# Patient Record
Sex: Female | Born: 1970 | Race: Black or African American | Hispanic: No | Marital: Married | Smoking: Never smoker
Health system: Southern US, Community
[De-identification: ages and names within clinical notes are randomized; demographics above are authoritative.]

## PROBLEM LIST (undated history)

## (undated) DIAGNOSIS — H539 Unspecified visual disturbance: Secondary | ICD-10-CM

## (undated) DIAGNOSIS — Z789 Other specified health status: Secondary | ICD-10-CM

## (undated) DIAGNOSIS — R51 Headache: Secondary | ICD-10-CM

## (undated) DIAGNOSIS — R519 Headache, unspecified: Secondary | ICD-10-CM

## (undated) HISTORY — DX: Headache: R51

## (undated) HISTORY — PX: WISDOM TOOTH EXTRACTION: SHX21

## (undated) HISTORY — DX: Headache, unspecified: R51.9

## (undated) HISTORY — DX: Unspecified visual disturbance: H53.9

---

## 1998-01-26 ENCOUNTER — Other Ambulatory Visit: Admission: RE | Admit: 1998-01-26 | Discharge: 1998-01-26 | Payer: Self-pay | Admitting: Obstetrics and Gynecology

## 1999-04-06 ENCOUNTER — Other Ambulatory Visit: Admission: RE | Admit: 1999-04-06 | Discharge: 1999-04-06 | Payer: Self-pay | Admitting: *Deleted

## 2009-05-26 ENCOUNTER — Ambulatory Visit: Payer: Self-pay | Admitting: Interventional Radiology

## 2009-05-26 ENCOUNTER — Emergency Department (HOSPITAL_BASED_OUTPATIENT_CLINIC_OR_DEPARTMENT_OTHER): Admission: EM | Admit: 2009-05-26 | Discharge: 2009-05-26 | Payer: Self-pay | Admitting: Emergency Medicine

## 2009-10-30 ENCOUNTER — Inpatient Hospital Stay (HOSPITAL_COMMUNITY): Admission: AD | Admit: 2009-10-30 | Discharge: 2009-10-30 | Payer: Self-pay | Admitting: Obstetrics and Gynecology

## 2009-10-31 ENCOUNTER — Ambulatory Visit (HOSPITAL_COMMUNITY): Admission: AD | Admit: 2009-10-31 | Discharge: 2009-10-31 | Payer: Self-pay | Admitting: Obstetrics and Gynecology

## 2010-09-12 IMAGING — CR DG ANKLE COMPLETE 3+V*L*
3 series · 3 of 3 positions shown · non-contrast
Comparison: None

CLINICAL DATA: Left ankle pain.

LEFT ANKLE COMPLETE - 3+ VIEW

[t ankle joint ap left]
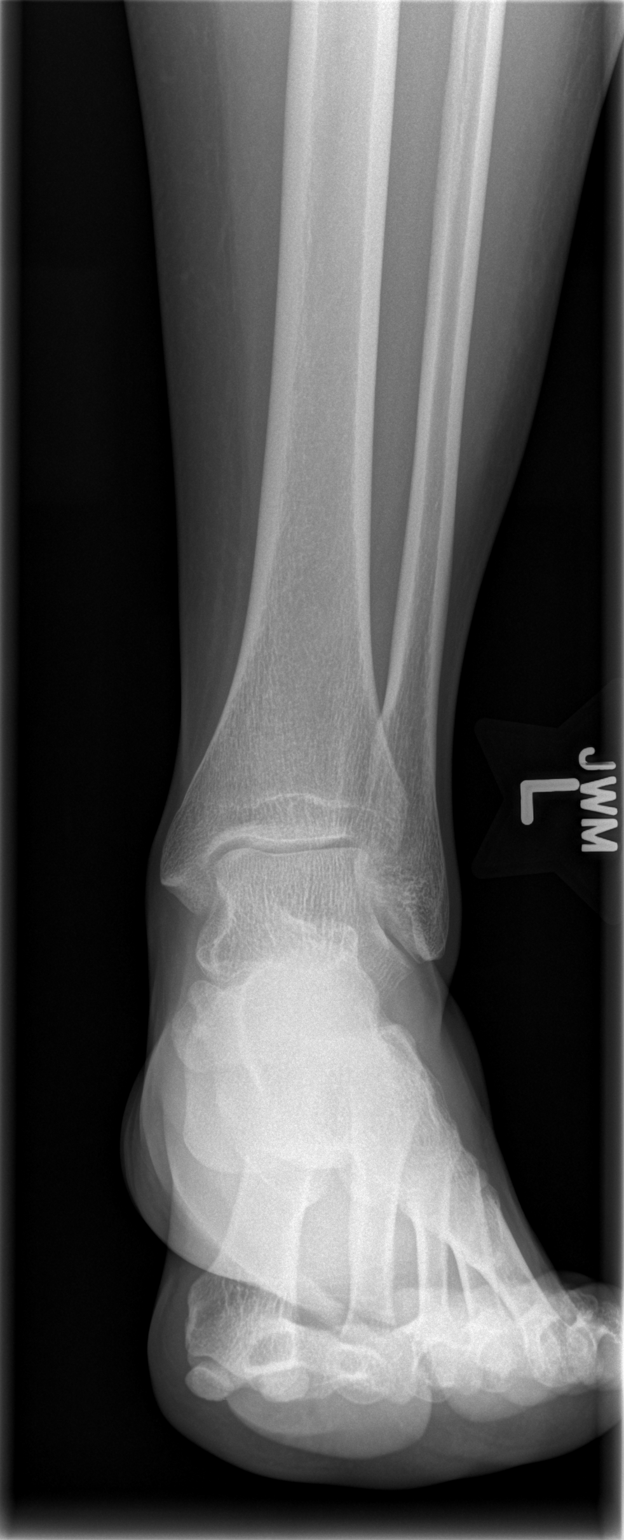

[t ankle joint oblique left]
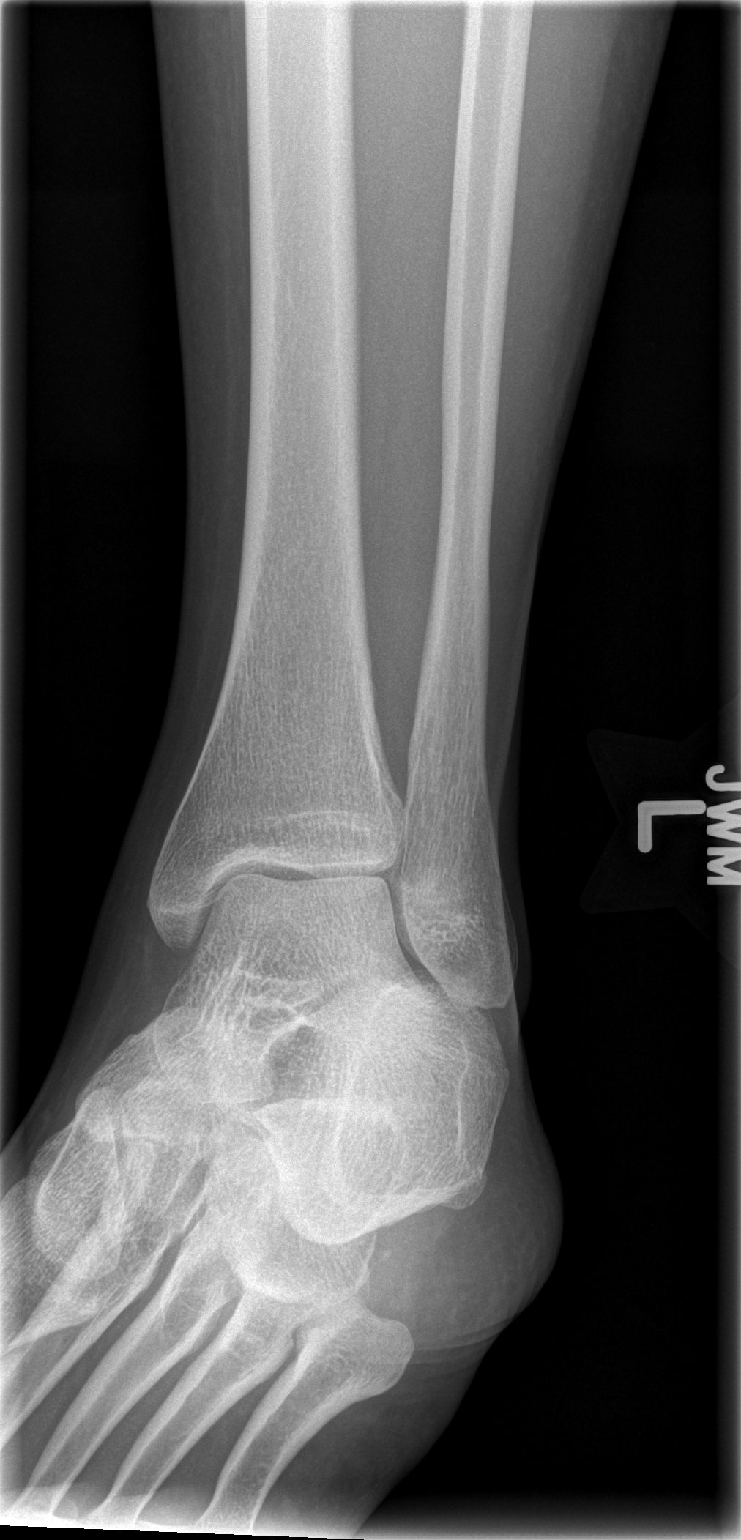

[t ankle joint lat left]
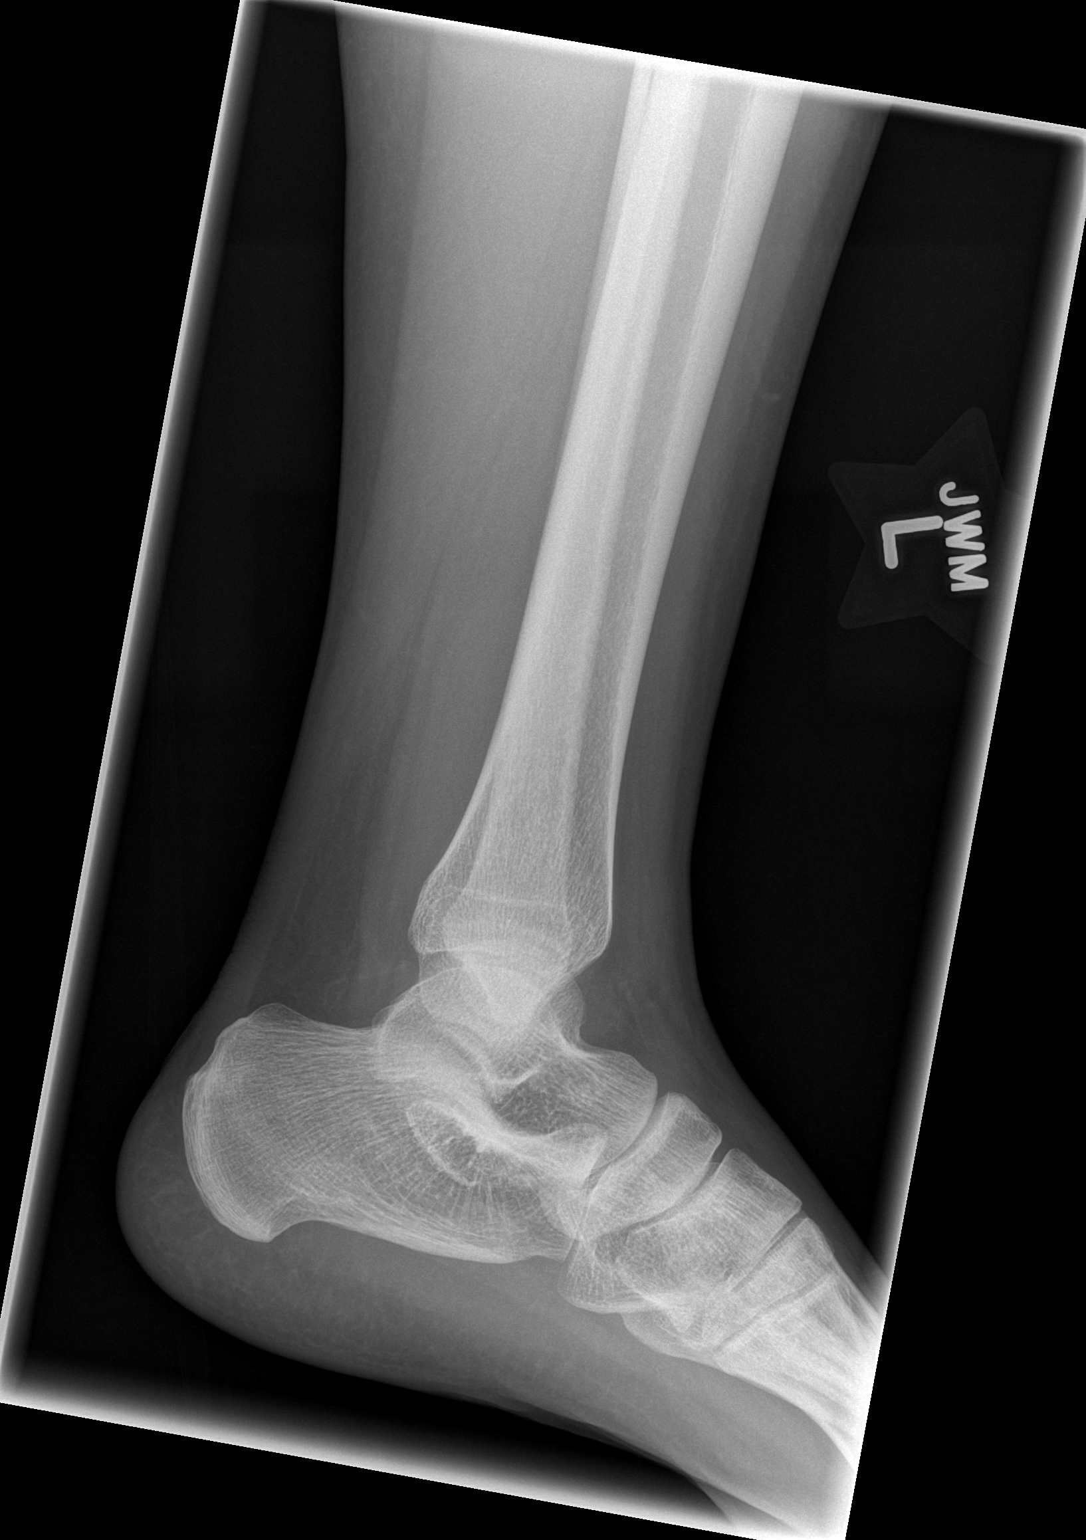

[3 of 3 positions shown; findings below may reference images not displayed]

FINDINGS: There is no evidence of fracture, dislocation or joint
effusion.  There is no evidence of arthropathy or other focal bone
abnormality.  Soft tissues are unremarkable.
IMPRESSION: Negative.

## 2010-09-26 NOTE — L&D Delivery Note (Signed)
Delivery Note  FHR with large variables, VE per RN complete. Arrived at bs and prepped for delivery, FHR 150's with variables. Pt pushed well.    At 8:50 PM a viable female was delivered via Vaginal, Spontaneous Delivery (Presentation: Right Occiput Anterior).   Infant was placed on mom's abdomen with lusty cry APGAR: 9, 9; weight 5 lb 14.7 oz (2685 g).   Placenta status: Intact, Spontaneous.  Cord: 3 vessels with the following complications: Short.  Placenta sent to pathology Routine cord blood collection: Cord blood donation: no   Anesthesia: Epidural  Episiotomy: None Lacerations:  1st degree Suture Repair: 3.0 vicryl rapide Est. Blood Loss (mL): 200cc  Mom to postpartum.  Baby to nursery-stable. Mom plans to BF  Elizabeth Andrews M 08/30/2011, 9:17 PM

## 2010-12-16 LAB — CBC
MCHC: 33.1 g/dL (ref 30.0–36.0)
RBC: 4.07 MIL/uL (ref 3.87–5.11)
WBC: 7.5 10*3/uL (ref 4.0–10.5)

## 2010-12-16 LAB — ABO/RH: ABO/RH(D): O POS

## 2010-12-16 LAB — HCG, QUANTITATIVE, PREGNANCY: hCG, Beta Chain, Quant, S: 87 m[IU]/mL — ABNORMAL HIGH (ref <5)

## 2010-12-16 LAB — TYPE AND SCREEN: Antibody Screen: NEGATIVE

## 2011-02-16 IMAGING — US US OB COMP LESS 14 WK
1 series · 13 of 28 positions shown · non-contrast
Comparison: None.

quantitative beta HCG only 87, presenting with vaginal bleeding
with the of 6 hours earlier.

OBSTETRIC <14 WK US AND TRANSVAGINAL OB US 10/30/2009:
TECHNIQUE: Both transabdominal and transvaginal ultrasound
examinations were performed for complete evaluation of the
gestation as well as the maternal uterus, adnexal regions, and
pelvic cul-de-sac.

[Series 1: us ob comp less 14 wks · 0.22mm/px · 47 acquisitions, 13 frames shown]
[im 2/47]
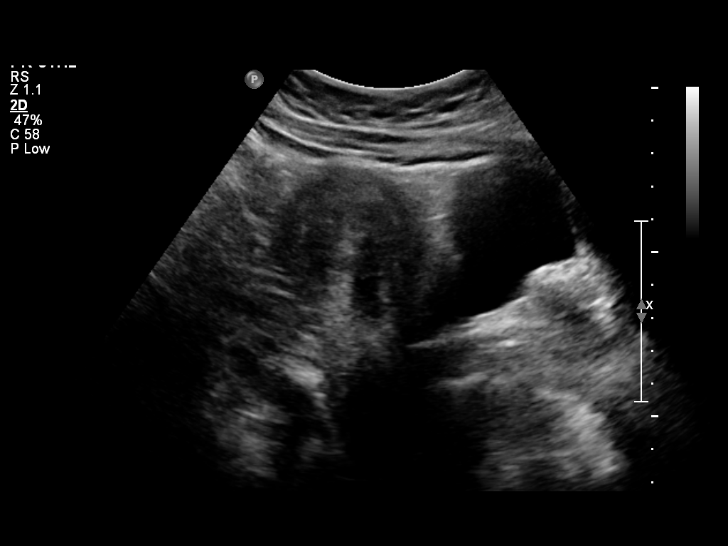
[im 6/47]
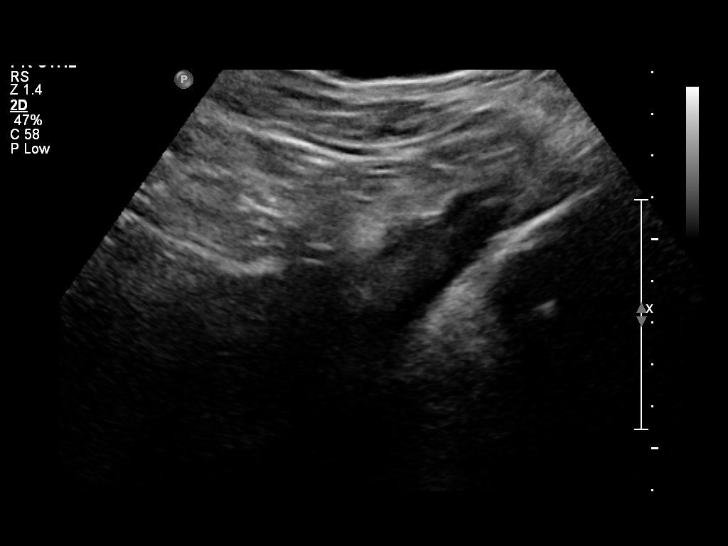
[im 9/47]
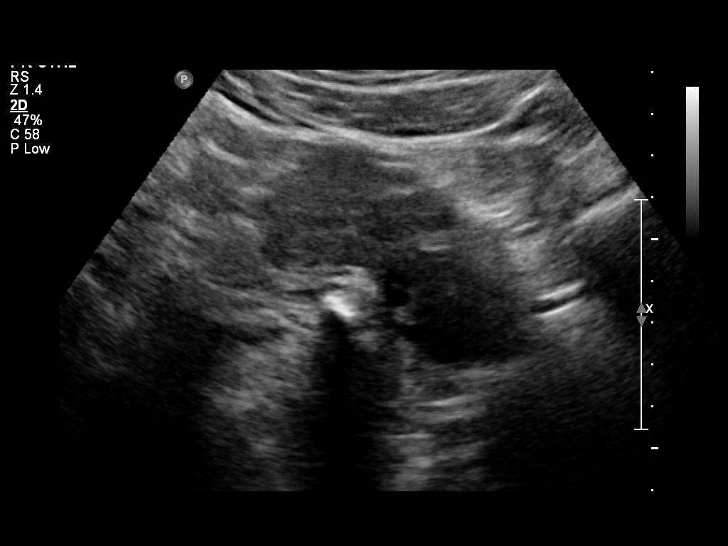
[im 12/47]
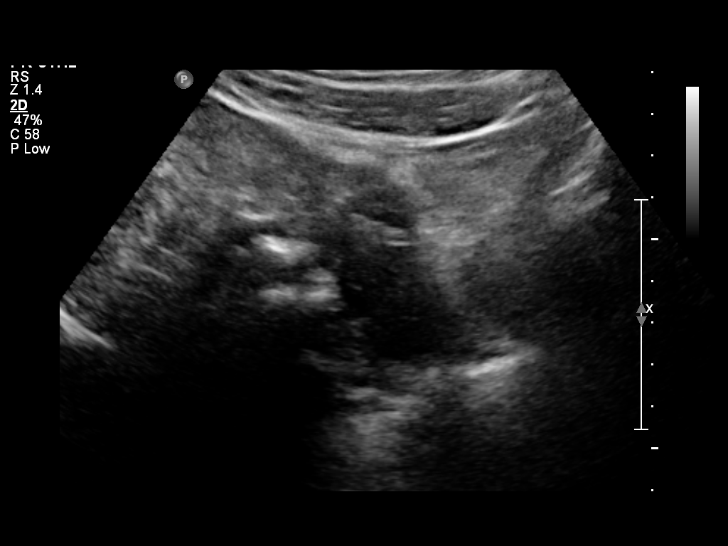
[im 16/47]
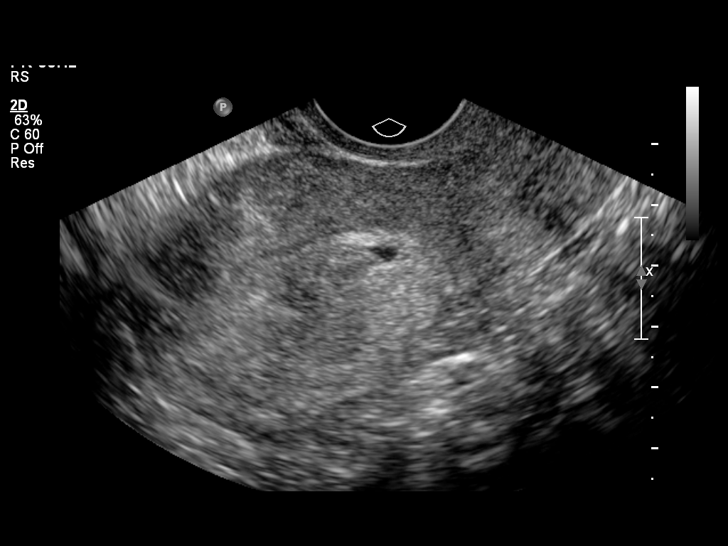
[im 19/47]
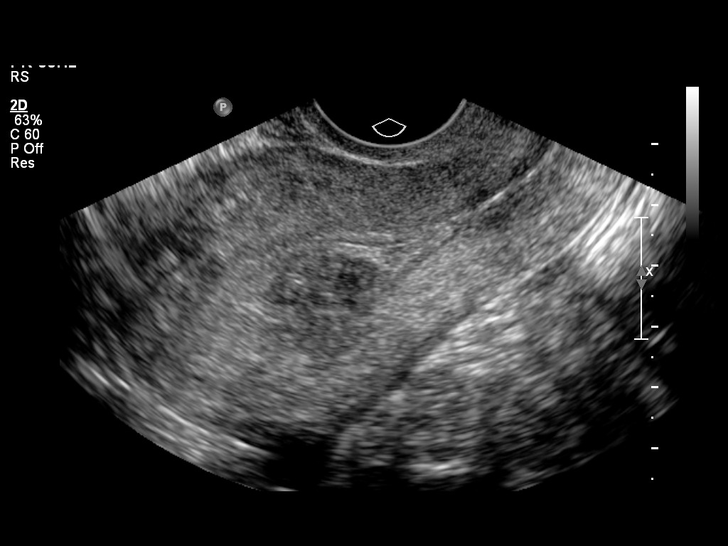
[im 24/47]
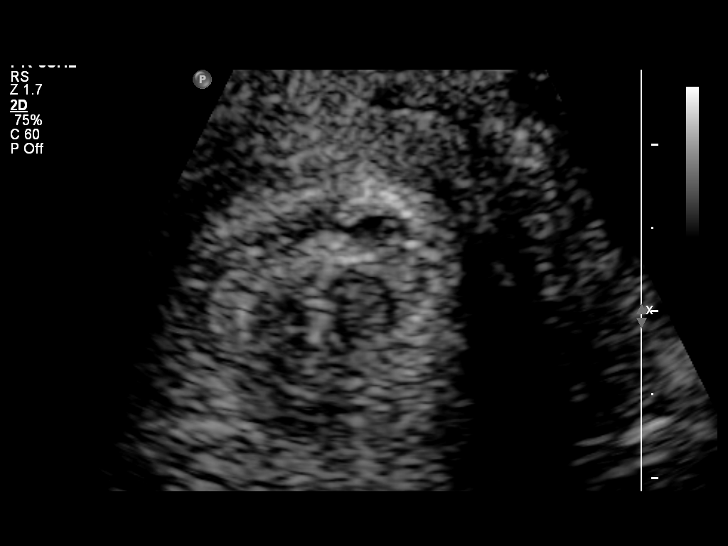
[im 28/47]
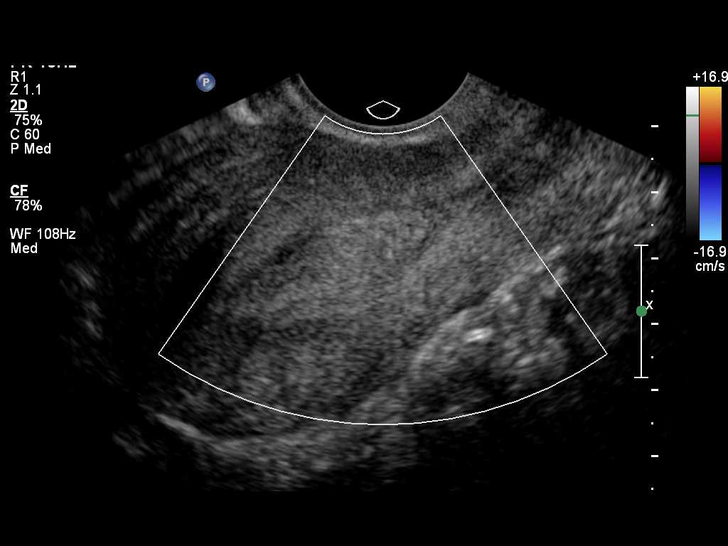
[im 31/47]
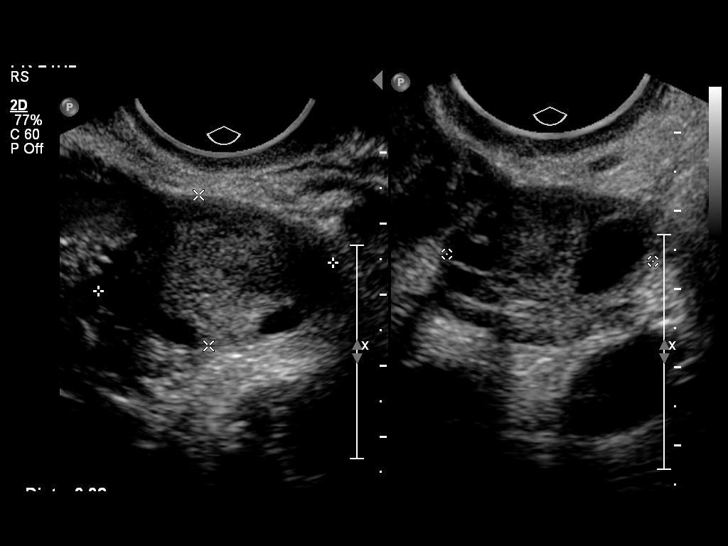
[im 35/47]
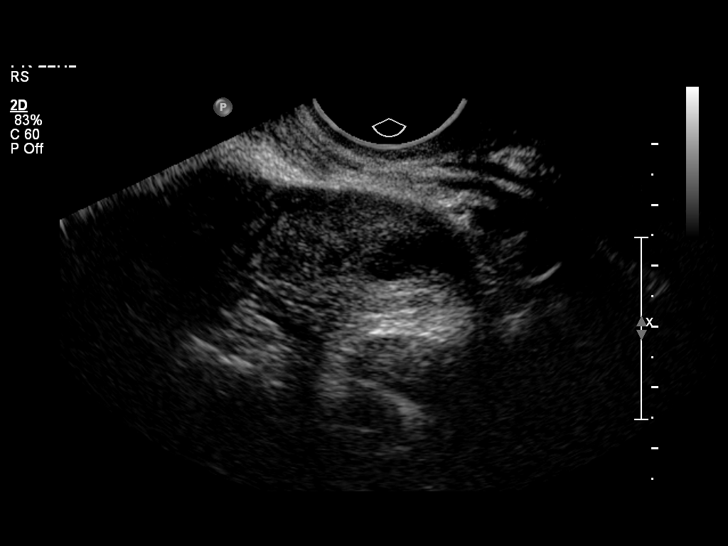
[im 38/47]
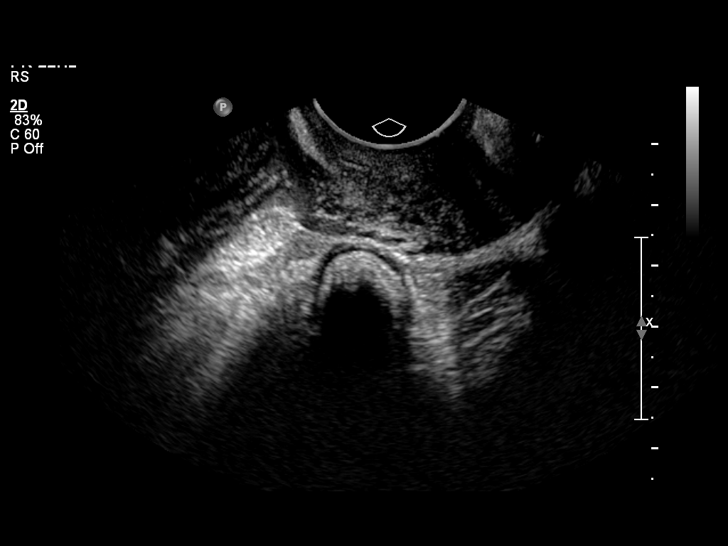
[im 41/47]
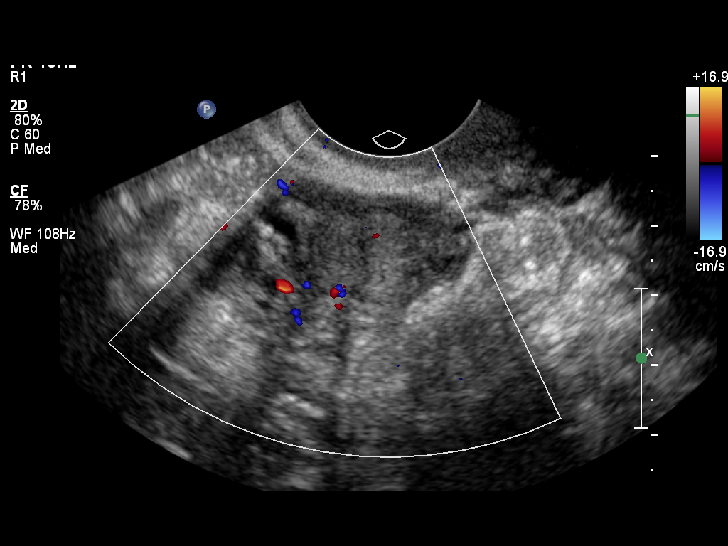
[im 45/47]
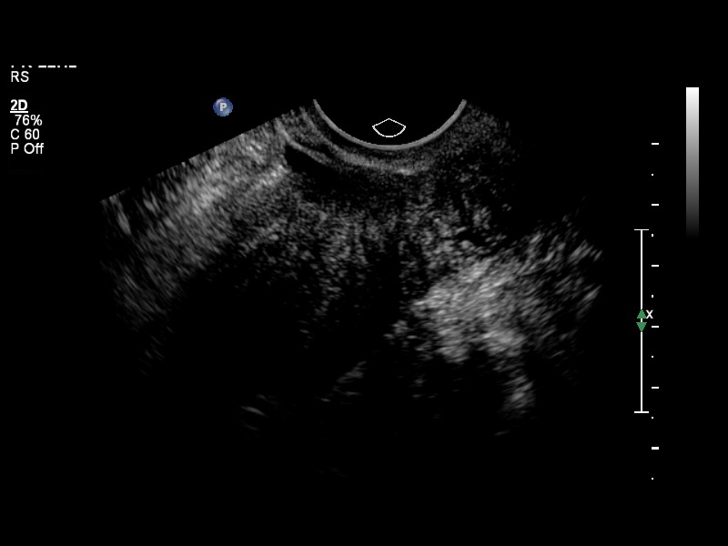

[13 of 28 positions shown; findings below may reference images not displayed]

Intrauterine gestational sac: Single, irregular.
Yolk sac: Visualized.
Embryo: Not visualized.
Cardiac Activity: Not visualized.

MSD: 3.1 mm
US EDC: Not available.

Maternal uterus/adnexae:
Tiny, irregular gestational sac in the lower uterine segment.
Approximate 2.1 x 1.2 x 0.9 cm solid lesion within the endometrial
cavity, demonstrating color Doppler signal.  Left ovary normal in
size and appearance, containing several small follicles, measuring
approximately 3.3 x 2.1 x 2.6 cm.  Right ovary normal in size and
appearance, containing several small follicles, measuring
approximately 3.1 x 1.6-2.2 cm.  No adnexal masses or free fluid.
IMPRESSION: 1.  Tiny, irregular gestational sac within the lower uterine
segment measuring only 3.1 mm, likely representing a blighted ovum
and impending spontaneous abortion.
2.  Approximate 2 cm solid lesion within the endometrial canal,
demonstrating color Doppler signal.  A primary endometrial lesion
or a submucosal fibroid mimicking an endometrial lesion are
considerations.

## 2011-08-30 ENCOUNTER — Inpatient Hospital Stay (HOSPITAL_COMMUNITY): Payer: 59 | Admitting: Anesthesiology

## 2011-08-30 ENCOUNTER — Encounter (HOSPITAL_COMMUNITY): Payer: Self-pay

## 2011-08-30 ENCOUNTER — Encounter (HOSPITAL_COMMUNITY): Payer: Self-pay | Admitting: Anesthesiology

## 2011-08-30 ENCOUNTER — Inpatient Hospital Stay (HOSPITAL_COMMUNITY)
Admission: AD | Admit: 2011-08-30 | Discharge: 2011-09-01 | DRG: 775 | Disposition: A | Discharge status: Home / Self Care | Payer: 59 | Source: Ambulatory Visit | Attending: Obstetrics and Gynecology | Admitting: Obstetrics and Gynecology

## 2011-08-30 DIAGNOSIS — O09529 Supervision of elderly multigravida, unspecified trimester: Secondary | ICD-10-CM | POA: Diagnosis present

## 2011-08-30 DIAGNOSIS — Z888 Allergy status to other drugs, medicaments and biological substances status: Secondary | ICD-10-CM | POA: Diagnosis present

## 2011-08-30 DIAGNOSIS — Z22330 Carrier of Group B streptococcus: Secondary | ICD-10-CM

## 2011-08-30 DIAGNOSIS — O99892 Other specified diseases and conditions complicating childbirth: Secondary | ICD-10-CM | POA: Diagnosis present

## 2011-08-30 DIAGNOSIS — Z3490 Encounter for supervision of normal pregnancy, unspecified, unspecified trimester: Secondary | ICD-10-CM

## 2011-08-30 DIAGNOSIS — Z6830 Body mass index (BMI) 30.0-30.9, adult: Secondary | ICD-10-CM

## 2011-08-30 HISTORY — DX: Other specified health status: Z78.9

## 2011-08-30 LAB — CBC
MCV: 82.2 fL (ref 78.0–100.0)
Platelets: 279 10*3/uL (ref 150–400)
RBC: 4.26 MIL/uL (ref 3.87–5.11)
RDW: 13.2 % (ref 11.5–15.5)

## 2011-08-30 LAB — RPR: RPR: NONREACTIVE

## 2011-08-30 LAB — HEPATITIS B SURFACE ANTIGEN: Hepatitis B Surface Ag: NEGATIVE

## 2011-08-30 LAB — ABO/RH: RH Type: POSITIVE

## 2011-08-30 MED ORDER — BENZOCAINE-MENTHOL 20-0.5 % EX AERO: 1.0000 "application " | INHALATION_SPRAY | CUTANEOUS | Status: DC | PRN

## 2011-08-30 MED ORDER — LACTATED RINGERS IV SOLN: 500.0000 mL | INTRAVENOUS | Status: DC | PRN

## 2011-08-30 MED ORDER — OXYTOCIN 10 UNIT/ML IJ SOLN
INTRAMUSCULAR | Status: AC
  Filled 2011-08-30: qty 2

## 2011-08-30 MED ORDER — FENTANYL 2.5 MCG/ML BUPIVACAINE 1/10 % EPIDURAL INFUSION (WH - ANES)
14.0000 mL/h | INTRAMUSCULAR | Status: DC
  Administered 2011-08-30: 14 mL/h via EPIDURAL
  Filled 2011-08-30 (×2): qty 60

## 2011-08-30 MED ORDER — OXYTOCIN BOLUS FROM INFUSION
500.0000 mL | Freq: Once | INTRAVENOUS | Status: DC
  Filled 2011-08-30: qty 1000
  Filled 2011-08-30: qty 500

## 2011-08-30 MED ORDER — DIBUCAINE 1 % RE OINT: 1.0000 "application " | TOPICAL_OINTMENT | RECTAL | Status: DC | PRN

## 2011-08-30 MED ORDER — LANOLIN HYDROUS EX OINT: TOPICAL_OINTMENT | CUTANEOUS | Status: DC | PRN

## 2011-08-30 MED ORDER — PANTOPRAZOLE SODIUM 20 MG PO TBEC
20.0000 mg | DELAYED_RELEASE_TABLET | Freq: Every day | ORAL | Status: DC
  Administered 2011-09-01: 20 mg via ORAL
  Filled 2011-08-30 (×2): qty 1

## 2011-08-30 MED ORDER — WITCH HAZEL-GLYCERIN EX PADS: 1.0000 "application " | MEDICATED_PAD | CUTANEOUS | Status: DC | PRN

## 2011-08-30 MED ORDER — PRENATAL PLUS 27-1 MG PO TABS
1.0000 | ORAL_TABLET | Freq: Every day | ORAL | Status: DC
  Administered 2011-09-01: 1 via ORAL
  Filled 2011-08-30: qty 1

## 2011-08-30 MED ORDER — OXYTOCIN 20 UNITS IN LACTATED RINGERS INFUSION - SIMPLE
1.0000 m[IU]/min | INTRAVENOUS | Status: DC
  Administered 2011-08-30: 2 m[IU]/min via INTRAVENOUS

## 2011-08-30 MED ORDER — DIPHENHYDRAMINE HCL 25 MG PO CAPS: 25.0000 mg | ORAL_CAPSULE | Freq: Four times a day (QID) | ORAL | Status: DC | PRN

## 2011-08-30 MED ORDER — FENTANYL 2.5 MCG/ML BUPIVACAINE 1/10 % EPIDURAL INFUSION (WH - ANES)
INTRAMUSCULAR | Status: DC | PRN
  Administered 2011-08-30: 14 mL/h via EPIDURAL

## 2011-08-30 MED ORDER — CITRIC ACID-SODIUM CITRATE 334-500 MG/5ML PO SOLN: 30.0000 mL | ORAL | Status: DC | PRN

## 2011-08-30 MED ORDER — ONDANSETRON HCL 4 MG/2ML IJ SOLN: 4.0000 mg | Freq: Four times a day (QID) | INTRAMUSCULAR | Status: DC | PRN

## 2011-08-30 MED ORDER — PENICILLIN G POTASSIUM 5000000 UNITS IJ SOLR
5.0000 10*6.[IU] | Freq: Once | INTRAVENOUS | Status: AC
  Administered 2011-08-30: 5 10*6.[IU] via INTRAVENOUS
  Filled 2011-08-30: qty 5

## 2011-08-30 MED ORDER — ZOLPIDEM TARTRATE 5 MG PO TABS: 5.0000 mg | ORAL_TABLET | Freq: Every evening | ORAL | Status: DC | PRN

## 2011-08-30 MED ORDER — EPHEDRINE 5 MG/ML INJ
10.0000 mg | INTRAVENOUS | Status: DC | PRN
  Filled 2011-08-30: qty 4

## 2011-08-30 MED ORDER — DIPHENHYDRAMINE HCL 50 MG/ML IJ SOLN: 12.5000 mg | INTRAMUSCULAR | Status: DC | PRN

## 2011-08-30 MED ORDER — ONDANSETRON HCL 4 MG/2ML IJ SOLN: 4.0000 mg | INTRAMUSCULAR | Status: DC | PRN

## 2011-08-30 MED ORDER — ACETAMINOPHEN 325 MG PO TABS: 650.0000 mg | ORAL_TABLET | ORAL | Status: DC | PRN

## 2011-08-30 MED ORDER — PHENYLEPHRINE 40 MCG/ML (10ML) SYRINGE FOR IV PUSH (FOR BLOOD PRESSURE SUPPORT): 80.0000 ug | PREFILLED_SYRINGE | INTRAVENOUS | Status: DC | PRN

## 2011-08-30 MED ORDER — LACTATED RINGERS IV SOLN
INTRAVENOUS | Status: DC
  Administered 2011-08-30 (×2): 125 mL/h via INTRAVENOUS

## 2011-08-30 MED ORDER — LACTATED RINGERS IV SOLN
500.0000 mL | Freq: Once | INTRAVENOUS | Status: AC
  Administered 2011-08-30: 500 mL via INTRAVENOUS

## 2011-08-30 MED ORDER — OXYTOCIN 20 UNITS IN LACTATED RINGERS INFUSION - SIMPLE: 125.0000 mL/h | Freq: Once | INTRAVENOUS | Status: DC

## 2011-08-30 MED ORDER — PHENYLEPHRINE 40 MCG/ML (10ML) SYRINGE FOR IV PUSH (FOR BLOOD PRESSURE SUPPORT)
80.0000 ug | PREFILLED_SYRINGE | INTRAVENOUS | Status: DC | PRN
  Filled 2011-08-30: qty 5

## 2011-08-30 MED ORDER — SENNOSIDES-DOCUSATE SODIUM 8.6-50 MG PO TABS
2.0000 | ORAL_TABLET | Freq: Every day | ORAL | Status: DC
  Administered 2011-08-31: 2 via ORAL

## 2011-08-30 MED ORDER — TETANUS-DIPHTH-ACELL PERTUSSIS 5-2.5-18.5 LF-MCG/0.5 IM SUSP: 0.5000 mL | Freq: Once | INTRAMUSCULAR | Status: DC

## 2011-08-30 MED ORDER — OXYCODONE-ACETAMINOPHEN 5-325 MG PO TABS: 2.0000 | ORAL_TABLET | ORAL | Status: DC | PRN

## 2011-08-30 MED ORDER — OXYCODONE-ACETAMINOPHEN 5-325 MG PO TABS: 1.0000 | ORAL_TABLET | ORAL | Status: DC | PRN

## 2011-08-30 MED ORDER — SIMETHICONE 80 MG PO CHEW: 80.0000 mg | CHEWABLE_TABLET | ORAL | Status: DC | PRN

## 2011-08-30 MED ORDER — PENICILLIN G POTASSIUM 5000000 UNITS IJ SOLR
2.5000 10*6.[IU] | INTRAMUSCULAR | Status: DC
  Administered 2011-08-30: 2.5 10*6.[IU] via INTRAVENOUS
  Filled 2011-08-30 (×4): qty 2.5

## 2011-08-30 MED ORDER — ONDANSETRON HCL 4 MG PO TABS: 4.0000 mg | ORAL_TABLET | ORAL | Status: DC | PRN

## 2011-08-30 MED ORDER — IBUPROFEN 600 MG PO TABS
600.0000 mg | ORAL_TABLET | Freq: Four times a day (QID) | ORAL | Status: DC
  Administered 2011-08-31 – 2011-09-01 (×6): 600 mg via ORAL
  Filled 2011-08-30 (×6): qty 1

## 2011-08-30 MED ORDER — OXYTOCIN 10 UNIT/ML IJ SOLN: 10.0000 [IU] | Freq: Once | INTRAMUSCULAR | Status: DC

## 2011-08-30 MED ORDER — LIDOCAINE HCL 1.5 % IJ SOLN
INTRAMUSCULAR | Status: DC | PRN
  Administered 2011-08-30 (×2): 4 mL via EPIDURAL

## 2011-08-30 MED ORDER — EPHEDRINE 5 MG/ML INJ: 10.0000 mg | INTRAVENOUS | Status: DC | PRN

## 2011-08-30 MED ORDER — TERBUTALINE SULFATE 1 MG/ML IJ SOLN: 0.2500 mg | Freq: Once | INTRAMUSCULAR | Status: DC | PRN

## 2011-08-30 MED ORDER — IBUPROFEN 600 MG PO TABS: 600.0000 mg | ORAL_TABLET | Freq: Four times a day (QID) | ORAL | Status: DC | PRN

## 2011-08-30 MED ORDER — LIDOCAINE HCL (PF) 1 % IJ SOLN
30.0000 mL | INTRAMUSCULAR | Status: DC | PRN
  Filled 2011-08-30 (×2): qty 30

## 2011-08-30 NOTE — Progress Notes (Signed)
Patient state she started leaking clear fluid about 1130, had a gush and continues to leak. Panties wet. Reports good fetal movement, no contractions.

## 2011-08-30 NOTE — H&P (Signed)
Elizabeth Andrews is a 40 y.o. female presenting for SROM at 1100, large gush of clear fluid, continues leaking. Denies ctx at present, has felt some mild cramping, denies VB, has +FM Pregnancy significant for: 1. AMA 2. Iodine allergy 3. S<D with growth normal at 37wks 4. Elevated BMI 5. Hx CT  Maternal Medical History:  Reason for admission: Reason for admission: rupture of membranes.  Contractions: Denies at present  Fetal activity: Perceived fetal activity is normal.   Last perceived fetal movement was within the past hour.    Prenatal complications: no prenatal complications   HPI: Pt entered care at [facility] under the care of Dr. Stefano Gaul, at 10wks, she received an initial [place] that measured at 12wks so EDC was changed to 12/19. She had a routine prenatal course, until 36wks and was measuring S<D, an US showed normal growth at 60%. She had a +GBS cx at 36wks.   Pt had a SVD in '97, female 7-15, epidural, without complications 2'11 - 7 wk SAB no comp 8'11 - 7 wk SAB no comp  OB History    Grav Para Term Preterm Abortions TAB SAB Ect Mult Living   4 1 1  0 2 0 2 0 0 1     Past Medical History  Diagnosis Date  . No pertinent past medical history    Past Surgical History  Procedure Date  . Wisdom tooth extraction    Family History:  Heart dx: Father CHTN: Mother, father Varicosities: mother IDDM: mother Stomach CA: father  Social History:  reports that she has never smoked. She has never used smokeless tobacco. She reports that she does not drink alcohol or use illicit drugs. Pt is married to Elizabeth Andrews, baptist faith, 34yrs of education.   Review of Systems  All other systems reviewed and are negative.    Dilation: 3 Effacement (%): 70;80 Station: -1 Exam by:: S Masiyah Jorstad CNM Blood pressure 137/79, pulse 80, temperature 98.2 F (36.8 C), temperature source Oral, resp. rate 18, height 5' 5.5" (1.664 m), weight 89.54 kg (197 lb 6.4 oz), SpO2 98.00%. Maternal Exam:    Uterine Assessment: Contraction strength is mild.  Contraction duration is 50 seconds. Contraction frequency is rare and regular.   Abdomen: Estimated fetal weight is 7#.   Fetal presentation: vertex  Introitus: Normal vulva. Vagina is positive for vaginal discharge.  Ferning test: not done.  Amniotic fluid character: clear.  Pelvis: adequate for delivery.   Cervix: Cervix evaluated by digital exam.     Fetal Exam Fetal Monitor Review: Mode: ultrasound.   Baseline rate: 140.  Variability: moderate (6-25 bpm).   Pattern: accelerations present and no decelerations.    Fetal State Assessment: Category I - tracings are normal.     Physical Exam  Nursing note and vitals reviewed. Constitutional: She is oriented to person, place, and time. Vital signs are normal. She appears well-developed and well-nourished.  Neck: Normal range of motion.  Cardiovascular: Normal rate, regular rhythm and normal heart sounds.   Respiratory: Effort normal and breath sounds normal.  GI: Soft. She exhibits no distension. There is no tenderness.  Genitourinary: Vaginal discharge found.       Grossly ruptured, clear fluid cx 3/75/-1 Same as previous exam in office  Musculoskeletal: Normal range of motion. She exhibits no edema and no tenderness.  Neurological: She is alert and oriented to person, place, and time. She has normal reflexes.  Skin: Skin is warm and dry.  Psychiatric: She has a normal  mood and affect. Her behavior is normal. Judgment and thought content normal.    Prenatal labs: ABO, Rh: O/Positive/-- (12/04 0000) Antibody:  neg Rubella: Immune (12/04 0000) RPR: Nonreactive (12/04 0000)  HBsAg: Negative (12/04 0000)  HIV: Non-reactive (12/04 0000)  GBS: Positive (12/04 0000)  1hr gtt 107 28wk labs - hgb 11.6, RPR - NR Pt declined any genetic screens or amnio  Assessment/Plan: IUP at [redacted]w[redacted]d SROM GBS pos AMA FHR reassuring  Admit to birthing suites, CNM care - Dr. Su Hilt  attending PCN for GBS prophylaxis Epidural when pt requests May consider pitocin if no cervical change 6-8 hours   Cloud Graham M 08/30/2011, 1:52 PM

## 2011-08-30 NOTE — Anesthesia Procedure Notes (Signed)
Epidural Patient location during procedure: OB Start time: 08/30/2011 3:36 PM  Staffing Anesthesiologist: Aryon Nham A. Performed by: anesthesiologist   Preanesthetic Checklist Completed: patient identified, site marked, surgical consent, pre-op evaluation, timeout performed, IV checked, risks and benefits discussed and monitors and equipment checked  Epidural Patient position: sitting Prep: site prepped and draped and DuraPrep Patient monitoring: continuous pulse ox and blood pressure Approach: midline Injection technique: LOR air  Needle:  Needle type: Tuohy  Needle gauge: 17 G Needle length: 9 cm Needle insertion depth: 5 cm cm Catheter type: closed end flexible Catheter size: 19 Gauge Catheter at skin depth: 10 cm Test dose: negative and 1.5% lidocaine  Assessment Events: blood not aspirated, injection not painful, no injection resistance, negative IV test and no paresthesia  Additional Notes Patient is more comfortable after epidural dosed. Please see RN's note for documentation of vital signs and FHR which are stable.

## 2011-08-30 NOTE — Anesthesia Preprocedure Evaluation (Signed)
Anesthesia Evaluation  Patient identified by MRN, date of birth, ID band Patient awake    Reviewed: Allergy & Precautions, H&P , Patient's Chart, lab work & pertinent test results  Airway Mallampati: III TM Distance: >3 FB Neck ROM: full    Dental No notable dental hx. (+) Teeth Intact   Pulmonary neg pulmonary ROS,  clear to auscultation  Pulmonary exam normal       Cardiovascular neg cardio ROS regular Normal    Neuro/Psych Negative Neurological ROS  Negative Psych ROS   GI/Hepatic negative GI ROS, Neg liver ROS,   Endo/Other  Negative Endocrine ROS  Renal/GU negative Renal ROS  Genitourinary negative   Musculoskeletal   Abdominal Normal abdominal exam  (+)   Peds  Hematology negative hematology ROS (+)   Anesthesia Other Findings   Reproductive/Obstetrics (+) Pregnancy                           Anesthesia Physical Anesthesia Plan  ASA: II  Anesthesia Plan: Epidural   Post-op Pain Management:    Induction:   Airway Management Planned:   Additional Equipment:   Intra-op Plan:   Post-operative Plan:   Informed Consent: I have reviewed the patients History and Physical, chart, labs and discussed the procedure including the risks, benefits and alternatives for the proposed anesthesia with the patient or authorized representative who has indicated his/her understanding and acceptance.     Plan Discussed with: Anesthesiologist  Anesthesia Plan Comments:         Anesthesia Quick Evaluation  

## 2011-08-31 ENCOUNTER — Encounter (HOSPITAL_COMMUNITY): Payer: Self-pay | Admitting: *Deleted

## 2011-08-31 ENCOUNTER — Other Ambulatory Visit: Payer: Self-pay | Admitting: Obstetrics and Gynecology

## 2011-08-31 LAB — CBC
Platelets: 264 10*3/uL (ref 150–400)
RBC: 4.04 MIL/uL (ref 3.87–5.11)
RDW: 13.4 % (ref 11.5–15.5)
WBC: 12.6 10*3/uL — ABNORMAL HIGH (ref 4.0–10.5)

## 2011-08-31 NOTE — Progress Notes (Signed)
Post Partum Day 1 Subjective: no complaints.  Up ad lib.  Breastfeeding.  Minimal pain.  Family at bedside.  Objective: Blood pressure 103/63, pulse 80, temperature 98.4 F (36.9 C), temperature source Oral, resp. rate 18, height 5' 5.5" (1.664 m), weight 89.54 kg (197 lb 6.4 oz), SpO2 96.00%, unknown if currently breastfeeding.  Physical Exam:  General: alert Lochia: appropriate Uterine Fundus: firm Incision: healing well DVT Evaluation: No evidence of DVT seen on physical exam. Negative Homan's sign.   Basename 08/31/11 0530 08/30/11 1301  HGB 11.2* 12.0  HCT 33.6* 35.0*    Assessment/Plan: Plan for discharge tomorrow Will discuss contraception with patient tomorrow, when privacy available.   LOS: 1 day   Elizabeth Andrews 08/31/2011, 11:13 AM

## 2011-08-31 NOTE — Anesthesia Postprocedure Evaluation (Signed)
  Anesthesia Post-op Note  Patient: Elizabeth Andrews  Procedure(s) Performed: * No procedures listed *  Patient Location: Mother/Baby  Anesthesia Type: Epidural  Level of Consciousness: alert  and oriented  Airway and Oxygen Therapy: Patient Spontanous Breathing  Post-op Pain: mild  Post-op Assessment: Patient's Cardiovascular Status Stable and Respiratory Function Stable  Post-op Vital Signs: stable  Complications: No apparent anesthesia complications

## 2011-09-01 MED ORDER — IBUPROFEN 600 MG PO TABS: 600.0000 mg | ORAL_TABLET | Freq: Four times a day (QID) | ORAL | Status: AC | PRN

## 2011-09-01 NOTE — Discharge Summary (Signed)
Obstetric Discharge Summary Reason for Admission: onset of labor and rupture of membranes Prenatal Procedures: ultrasound Intrapartum Procedures: spontaneous vaginal delivery Postpartum Procedures: none Complications-Operative and Postpartum: 1st degree perineal laceration Hemoglobin  Date Value Range Status  08/31/2011 11.2* 12.0-15.0 (g/dL) Final     HCT  Date Value Range Status  08/31/2011 33.6* 36.0-46.0 (%) Final    Hospital Course: Admitted in early labor with SROM on 08/29/11.  Positive GBS. Progressed well. Delivery was performed by Sanda Klein without difficulty. Patient and baby tolerated the procedure without difficulty, with a 1st degree laceration noted. Infant to FTN. Mother and infant then had an uncomplicated postpartum course, with breast feeding going well. Mom's physical exam was WNL, and she was discharged home in stable condition. Contraception plan--considering outpatient BTL, but no completely decided..  She received adequate benefit from po pain medications.  Information regarding outpatient BTL will be sent to patient.  Physical exam on day of discharge: Chest clear Heart RRR without murmur Abd soft, NT Fundus firm, NT Lochia scant Perineum healing Ext WNL, no evidence DVT  Discharge Diagnoses: Term Pregnancy-delivered  Discharge Information: Date: 09/01/2011 Activity: Per [facility] handout Diet: routine Medications: Ibuprofen Condition: stable Instructions: refer to practice specific booklet Discharge to: home Contraception:  Considering outpatient BTL Follow-up Information    Follow up with [facility] in 4 weeks.         Newborn Data: Live born female  Birth Weight: 5 lb 14.7 oz (2685 g) APGAR: 9, 9  Home with mother.  Nigel Bridgeman 09/01/2011, 7:39 AM

## 2011-10-11 ENCOUNTER — Other Ambulatory Visit: Payer: Self-pay | Admitting: Obstetrics and Gynecology

## 2011-10-12 ENCOUNTER — Encounter (HOSPITAL_COMMUNITY): Payer: Self-pay | Admitting: Pharmacist

## 2011-10-12 ENCOUNTER — Other Ambulatory Visit: Payer: Self-pay | Admitting: Obstetrics and Gynecology

## 2011-10-17 ENCOUNTER — Encounter (HOSPITAL_COMMUNITY): Payer: Self-pay

## 2011-10-17 ENCOUNTER — Encounter (HOSPITAL_COMMUNITY)
Admission: RE | Admit: 2011-10-17 | Discharge: 2011-10-17 | Disposition: A | Discharge status: Home / Self Care | Payer: 59 | Source: Ambulatory Visit | Attending: Obstetrics and Gynecology | Admitting: Obstetrics and Gynecology

## 2011-10-17 LAB — CBC
Platelets: 273 10*3/uL (ref 150–400)
RDW: 13 % (ref 11.5–15.5)
WBC: 5.1 10*3/uL (ref 4.0–10.5)

## 2011-10-17 LAB — SURGICAL PCR SCREEN: Staphylococcus aureus: NEGATIVE

## 2011-10-17 NOTE — Patient Instructions (Addendum)
   Your procedure is scheduled on: Saturday January 26th  Enter through the Main Entrance of [facility] at:7:30am Pick up the phone at the desk and dial [phone] and inform us of your arrival.  Please call this number if you have any problems the morning of surgery: [phone]  Remember: Do not eat food after midnight: Friday Do not drink clear liquids after:midnight Friday Take these medicines the morning of surgery with a SIP OF WATER:none  Do not wear jewelry, make-up, or FINGER nail polish Do not wear lotions, powders, perfumes or deodorant. Do not shave 48 hours prior to surgery. Do not bring valuables to the hospital.    Patients discharged on the day of surgery will not be allowed to drive home.     Remember to use your hibiclens as instructed.Please shower with 1/2 bottle the evening before your surgery and the other 1/2 bottle the morning of surgery.

## 2011-10-21 NOTE — H&P (Signed)
Elizabeth Andrews, Elizabeth Andrews             ACCOUNT NO.:  [id number]  MEDICAL RECORD NO.:  [id number]  LOCATION:  PERIO                         FACILITY:  [facility]  PHYSICIAN:  Janine Limbo, M.D.DATE OF BIRTH:  05-03-1971  DATE OF ADMISSION:  10/11/2011 DATE OF DISCHARGE:                             HISTORY & PHYSICAL   HISTORY OF PRESENT ILLNESS:  Elizabeth Andrews is a 41 year old female, para 2-0- 2-2, who presents for a laparoscopic tubal cautery.  The patient has been followed at the [facility] and [facility] of [facility].  OBSTETRICAL HISTORY:  The patient has had 2 term vaginal deliveries and 2 first trimester miscarriages.  She did have a dilatation and curettage for one of her miscarriages.  DRUG ALLERGIES:  The patient is allergic to IODINE and this causes hives.  SOCIAL HISTORY:  The patient denies cigarette use, alcohol use, and recreational drug use.  PAST MEDICAL HISTORY:  The patient has had her wisdom teeth removed. She has had 2 vaginal deliveries.  REVIEW OF SYSTEMS:  Please see history of present illness.  FAMILY HISTORY:  Noncontributory.  PHYSICAL EXAMINATION:  VITAL SIGNS:  Height is 5 feet and 5 inches. Weight is 180 pounds. HEENT:  Within normal limits. CHEST:  Clear. HEART:  Regular rate and rhythm. BREASTS:  Lactating and nontender. ABDOMEN:  Nontender. EXTREMITIES:  Within normal limits. NEUROLOGIC:  Grossly normal. PELVIC:  External genitalia is normal.  Vagina is normal.  Cervix is nontender.  Uterus is normal size, shape, and consistency and adnexa, no masses.  ASSESSMENT:  Desires sterilization.  PLAN:  The patient will undergo a laparoscopic tubal cautery.  She understands the indications for her surgical procedure, and she accepts the risks of, but not limited to, anesthetic complications, bleeding, infections, possible damage to the surrounding organs, and possible tubal failure  (17/1000).     Janine Limbo, M.D.     AVS/MEDQ  D:  10/20/2011  T:  10/21/2011  Job:  [id number]

## 2011-10-22 ENCOUNTER — Encounter (HOSPITAL_COMMUNITY): Payer: Self-pay | Admitting: Anesthesiology

## 2011-10-22 ENCOUNTER — Ambulatory Visit (HOSPITAL_COMMUNITY)
Admission: RE | Admit: 2011-10-22 | Discharge: 2011-10-22 | Disposition: A | Discharge status: Home / Self Care | Payer: 59 | Source: Ambulatory Visit | Attending: Obstetrics and Gynecology | Admitting: Obstetrics and Gynecology

## 2011-10-22 ENCOUNTER — Encounter (HOSPITAL_COMMUNITY)
Admission: RE | Disposition: A | Discharge status: Home / Self Care | Payer: Self-pay | Source: Ambulatory Visit | Attending: Obstetrics and Gynecology

## 2011-10-22 ENCOUNTER — Ambulatory Visit (HOSPITAL_COMMUNITY): Payer: 59 | Admitting: Anesthesiology

## 2011-10-22 ENCOUNTER — Encounter (HOSPITAL_COMMUNITY): Payer: Self-pay | Admitting: *Deleted

## 2011-10-22 DIAGNOSIS — Z01818 Encounter for other preprocedural examination: Secondary | ICD-10-CM | POA: Insufficient documentation

## 2011-10-22 DIAGNOSIS — Z302 Encounter for sterilization: Secondary | ICD-10-CM | POA: Insufficient documentation

## 2011-10-22 DIAGNOSIS — Z01812 Encounter for preprocedural laboratory examination: Secondary | ICD-10-CM | POA: Insufficient documentation

## 2011-10-22 HISTORY — PX: LAPAROSCOPIC TUBAL LIGATION: SHX1937

## 2011-10-22 LAB — PREGNANCY, URINE: Preg Test, Ur: NEGATIVE

## 2011-10-22 SURGERY — LIGATION, FALLOPIAN TUBE, LAPAROSCOPIC
Anesthesia: General | Laterality: Bilateral

## 2011-10-22 MED ORDER — FENTANYL CITRATE 0.05 MG/ML IJ SOLN
INTRAMUSCULAR | Status: AC
  Filled 2011-10-22: qty 2

## 2011-10-22 MED ORDER — ROCURONIUM BROMIDE 100 MG/10ML IV SOLN
INTRAVENOUS | Status: DC | PRN
  Administered 2011-10-22: 40 mg via INTRAVENOUS

## 2011-10-22 MED ORDER — FENTANYL CITRATE 0.05 MG/ML IJ SOLN: 25.0000 ug | INTRAMUSCULAR | Status: DC | PRN

## 2011-10-22 MED ORDER — DEXAMETHASONE SODIUM PHOSPHATE 10 MG/ML IJ SOLN
INTRAMUSCULAR | Status: AC
  Filled 2011-10-22: qty 1

## 2011-10-22 MED ORDER — NEOSTIGMINE METHYLSULFATE 1 MG/ML IJ SOLN
INTRAMUSCULAR | Status: AC
  Filled 2011-10-22: qty 10

## 2011-10-22 MED ORDER — LIDOCAINE HCL (CARDIAC) 20 MG/ML IV SOLN
INTRAVENOUS | Status: DC | PRN
  Administered 2011-10-22: 100 mg via INTRAVENOUS

## 2011-10-22 MED ORDER — MEPERIDINE HCL 25 MG/ML IJ SOLN: 6.2500 mg | INTRAMUSCULAR | Status: DC | PRN

## 2011-10-22 MED ORDER — METOCLOPRAMIDE HCL 5 MG/ML IJ SOLN: 10.0000 mg | Freq: Once | INTRAMUSCULAR | Status: DC | PRN

## 2011-10-22 MED ORDER — ONDANSETRON HCL 4 MG/2ML IJ SOLN
INTRAMUSCULAR | Status: AC
  Filled 2011-10-22: qty 2

## 2011-10-22 MED ORDER — KETOROLAC TROMETHAMINE 30 MG/ML IJ SOLN
INTRAMUSCULAR | Status: DC | PRN
  Administered 2011-10-22: 30 mg via INTRAMUSCULAR
  Administered 2011-10-22: 30 mg via INTRAVENOUS

## 2011-10-22 MED ORDER — FENTANYL CITRATE 0.05 MG/ML IJ SOLN
INTRAMUSCULAR | Status: DC | PRN
  Administered 2011-10-22 (×2): 50 ug via INTRAVENOUS

## 2011-10-22 MED ORDER — IBUPROFEN 200 MG PO TABS: 800.0000 mg | ORAL_TABLET | Freq: Three times a day (TID) | ORAL | Status: AC | PRN

## 2011-10-22 MED ORDER — NEOSTIGMINE METHYLSULFATE 1 MG/ML IJ SOLN
INTRAMUSCULAR | Status: DC | PRN
  Administered 2011-10-22: 3 mg via INTRAVENOUS

## 2011-10-22 MED ORDER — HYDROCODONE-ACETAMINOPHEN 5-500 MG PO TABS: 1.0000 | ORAL_TABLET | ORAL | Status: AC | PRN

## 2011-10-22 MED ORDER — GLYCOPYRROLATE 0.2 MG/ML IJ SOLN
INTRAMUSCULAR | Status: AC
  Filled 2011-10-22: qty 1

## 2011-10-22 MED ORDER — MIDAZOLAM HCL 5 MG/5ML IJ SOLN
INTRAMUSCULAR | Status: DC | PRN
  Administered 2011-10-22: 2 mg via INTRAVENOUS

## 2011-10-22 MED ORDER — PROPOFOL 10 MG/ML IV EMUL
INTRAVENOUS | Status: DC | PRN
  Administered 2011-10-22: 20 mL via INTRAVENOUS

## 2011-10-22 MED ORDER — ONDANSETRON HCL 4 MG/2ML IJ SOLN
INTRAMUSCULAR | Status: DC | PRN
  Administered 2011-10-22: 4 mg via INTRAVENOUS

## 2011-10-22 MED ORDER — GLYCOPYRROLATE 0.2 MG/ML IJ SOLN
INTRAMUSCULAR | Status: DC | PRN
  Administered 2011-10-22: .6 mg via INTRAVENOUS

## 2011-10-22 MED ORDER — LACTATED RINGERS IV SOLN
INTRAVENOUS | Status: DC | PRN
  Administered 2011-10-22 (×2): via INTRAVENOUS

## 2011-10-22 MED ORDER — BUPIVACAINE-EPINEPHRINE (PF) 0.5% -1:200000 IJ SOLN
INTRAMUSCULAR | Status: AC
  Filled 2011-10-22: qty 10

## 2011-10-22 MED ORDER — LIDOCAINE HCL (CARDIAC) 20 MG/ML IV SOLN
INTRAVENOUS | Status: AC
  Filled 2011-10-22: qty 5

## 2011-10-22 MED ORDER — PROMETHAZINE HCL 12.5 MG PO TABS: 12.5000 mg | ORAL_TABLET | Freq: Four times a day (QID) | ORAL | Status: AC | PRN

## 2011-10-22 MED ORDER — MIDAZOLAM HCL 2 MG/2ML IJ SOLN
INTRAMUSCULAR | Status: AC
  Filled 2011-10-22: qty 2

## 2011-10-22 MED ORDER — BUPIVACAINE-EPINEPHRINE 0.5% -1:200000 IJ SOLN
INTRAMUSCULAR | Status: DC | PRN
  Administered 2011-10-22: 4 mL

## 2011-10-22 MED ORDER — LACTATED RINGERS IV SOLN
INTRAVENOUS | Status: DC
  Administered 2011-10-22: 08:00:00 via INTRAVENOUS

## 2011-10-22 SURGICAL SUPPLY — 16 items
CATH ROBINSON RED A/P 16FR (CATHETERS) ×2 IMPLANT
CHLORAPREP W/TINT 26ML (MISCELLANEOUS) ×2 IMPLANT
CLOTH BEACON ORANGE TIMEOUT ST (SAFETY) ×2 IMPLANT
DERMABOND ADVANCED (GAUZE/BANDAGES/DRESSINGS) ×1
DERMABOND ADVANCED .7 DNX12 (GAUZE/BANDAGES/DRESSINGS) ×1 IMPLANT
GLOVE BIOGEL PI IND STRL 8.5 (GLOVE) ×1 IMPLANT
GLOVE BIOGEL PI INDICATOR 8.5 (GLOVE) ×1
GLOVE ECLIPSE 8.0 STRL XLNG CF (GLOVE) ×4 IMPLANT
GLOVE SURG SS PI 7.5 STRL IVOR (GLOVE) ×2 IMPLANT
GOWN PREVENTION PLUS LG XLONG (DISPOSABLE) ×4 IMPLANT
GOWN PREVENTION PLUS XXLARGE (GOWN DISPOSABLE) ×2 IMPLANT
PACK LAPAROSCOPY BASIN (CUSTOM PROCEDURE TRAY) ×2 IMPLANT
SUT MNCRL AB 4-0 PS2 18 (SUTURE) ×2 IMPLANT
SUT VICRYL 0 UR6 27IN ABS (SUTURE) ×2 IMPLANT
TOWEL OR 17X24 6PK STRL BLUE (TOWEL DISPOSABLE) ×4 IMPLANT
WATER STERILE IRR 1000ML POUR (IV SOLUTION) ×2 IMPLANT

## 2011-10-22 NOTE — H&P (Signed)
The patient was interviewed and examined today.  The previously documented history and physical examination was reviewed. There are no changes. The operative procedure was reviewed. The risks and benefits were outlined again. The specific risks include, but are not limited to, anesthetic complications, bleeding, infections, and possible damage to the surrounding organs. The patient's questions were answered.  We are ready to proceed as outlined. The likelihood of the patient achieving the goals of this procedure is very likely.   Elizabeth Andrews Elizabeth Andrews, M.D.  

## 2011-10-22 NOTE — Anesthesia Preprocedure Evaluation (Signed)
Anesthesia Evaluation  Patient identified by MRN, date of birth, ID band Patient awake    Reviewed: Allergy & Precautions, H&P , NPO status , Patient's Chart, lab work & pertinent test results  Airway Mallampati: II TM Distance: >3 FB Neck ROM: full    Dental No notable dental hx. (+) Teeth Intact   Pulmonary neg pulmonary ROS,  clear to auscultation  Pulmonary exam normal       Cardiovascular neg cardio ROS Normal    Neuro/Psych Negative Neurological ROS  Negative Psych ROS   GI/Hepatic negative GI ROS, Neg liver ROS,   Endo/Other  Negative Endocrine ROS  Renal/GU negative Renal ROS  Genitourinary negative   Musculoskeletal   Abdominal Normal abdominal exam  (+)   Peds  Hematology negative hematology ROS (+)   Anesthesia Other Findings   Reproductive/Obstetrics negative OB ROS                           Anesthesia Physical Anesthesia Plan  ASA: I  Anesthesia Plan: General ETT   Post-op Pain Management:    Induction:   Airway Management Planned:   Additional Equipment:   Intra-op Plan:   Post-operative Plan:   Informed Consent: I have reviewed the patients History and Physical, chart, labs and discussed the procedure including the risks, benefits and alternatives for the proposed anesthesia with the patient or authorized representative who has indicated his/her understanding and acceptance.   Dental Advisory Given  Plan Discussed with: Anesthesiologist, CRNA and Surgeon  Anesthesia Plan Comments:         Anesthesia Quick Evaluation

## 2011-10-22 NOTE — Transfer of Care (Signed)
Immediate Anesthesia Transfer of Care Note  Patient: Elizabeth Andrews  Procedure(s) Performed:  LAPAROSCOPIC TUBAL LIGATION  Patient Location: PACU  Anesthesia Type: General  Level of Consciousness: awake and sedated  Airway & Oxygen Therapy: Patient connected to nasal cannula oxygen  Post-op Assessment: Report given to PACU RN  Post vital signs: Reviewed and stable  Complications: No apparent anesthesia complications

## 2011-10-22 NOTE — Op Note (Signed)
OPERATIVE NOTE  Elizabeth Andrews  DOB:    December 17, 1970  MRN:    [id number]  CSN:    [id number]  Date of Surgery:  10/22/2011  Preoperative Diagnosis:  Desires sterilization  Postoperative Diagnosis:  Desires sterilization  Procedure:  Laparoscopic tubal cautery  Surgeon:  Leonard Schwartz, M.D.  Assistant:  None  Anesthetic:  General  Disposition:  The patient is a 41 year old female who desires sterilization. She understands the indications for her procedure and the alternative treatment options. She accepts the risk of, but not limited to, anesthetic complications, bleeding, infections, and possible damage to the surrounding organs. She understands that there is a small but real failure rate associated with this procedure (17 per 1000).  Findings:  The uterus, fallopian tubes, ovaries, bowel, and liver appeared normal.  Procedure:  The patient was taken to the operating room where a general anesthetic was given. The patient's abdomen was prepped with ChloraPrep. The perineum and vagina were prepped with Technicare. The bladder was drained of urine. Examination under anesthesia was performed. A Hulka tenaculum was placed inside the uterus. The patient was sterilely draped. A subumbilical incision was made after injecting the subumbilical area with 4 cc of half percent Marcaine with epinephrine. The varies needle inserted into the abdominal cavity without difficulty. Proper placement was confirmed using the saline drop test. A pneumoperitoneum was obtained. The laparoscopic trocar and the laparoscope was substituted for the varies needle. The pelvic organs were visualized with findings as mentioned above. Pictures were taken. The right fallopian tube was identified and followed to fimbriated end. The proximal portion of the right fallopian tube was cauterized in several segments. Hemostasis was adequate. An identical procedure was carried out on the opposite side. Again  hemostasis was adequate. The pelvic structures were visualized and the abdominal structures were visualized. No harm was noted. The pneumoperitoneum was allowed to escape. On sutures were removed. The subumbilical fascia was closed using 0 Vicryl. The subumbilical skin was closed using 4-0 Monocryl. Sponge, needle, and isthmic counts were correct. Estimated blood loss was 5 cc. The patient tolerated her procedure well. She was awakened from her anesthetic without difficulty. She was transported to the recovery room in stable condition. There were no complications and no equipment failures.  Followup instructions:  The patient will return to see Dr. Stefano Gaul in 2 weeks. She was given a prescription for Motrin, Vicodin, and Phenergan. She was given a copy of the postoperative instructions as prepared by Epic.  Leonard Schwartz, M.D.

## 2011-10-22 NOTE — Anesthesia Postprocedure Evaluation (Signed)
  Anesthesia Post-op Note  Patient: Elizabeth Andrews  Procedure(s) Performed:  LAPAROSCOPIC TUBAL LIGATION  Patient Location: PACU  Anesthesia Type: General  Level of Consciousness: awake, alert  and oriented  Airway and Oxygen Therapy: Patient Spontanous Breathing  Post-op Pain: mild  Post-op Assessment: Post-op Vital signs reviewed, Patient's Cardiovascular Status Stable, Respiratory Function Stable, Patent Airway, No signs of Nausea or vomiting and Pain level controlled  Post-op Vital Signs: Reviewed and stable  Complications: No apparent anesthesia complications

## 2011-10-24 ENCOUNTER — Encounter (HOSPITAL_COMMUNITY): Payer: Self-pay | Admitting: Obstetrics and Gynecology

## 2011-11-22 ENCOUNTER — Encounter (HOSPITAL_COMMUNITY): Payer: Self-pay

## 2012-08-14 ENCOUNTER — Encounter: Payer: Self-pay | Admitting: Obstetrics and Gynecology

## 2012-08-17 ENCOUNTER — Telehealth: Payer: Self-pay

## 2012-08-17 ENCOUNTER — Telehealth: Payer: Self-pay | Admitting: Obstetrics and Gynecology

## 2012-08-17 NOTE — Telephone Encounter (Signed)
Pt called back regarding message  LMOVM for pt to call back.  [facility] CMA

## 2012-08-17 NOTE — Telephone Encounter (Signed)
avs pt 

## 2012-08-17 NOTE — Telephone Encounter (Signed)
Pt made aware of Dense breast info.  Pt voiced understanding  [facility] CMA

## 2014-01-29 ENCOUNTER — Other Ambulatory Visit: Payer: Self-pay | Admitting: Obstetrics and Gynecology

## 2014-02-05 ENCOUNTER — Other Ambulatory Visit: Payer: Self-pay | Admitting: Obstetrics and Gynecology

## 2014-07-11 ENCOUNTER — Other Ambulatory Visit: Payer: Self-pay | Admitting: Obstetrics and Gynecology

## 2014-07-22 ENCOUNTER — Encounter (HOSPITAL_COMMUNITY)
Admission: RE | Admit: 2014-07-22 | Discharge: 2014-07-22 | Disposition: A | Discharge status: Home / Self Care | Payer: 59 | Source: Ambulatory Visit | Attending: Obstetrics and Gynecology | Admitting: Obstetrics and Gynecology

## 2014-07-22 ENCOUNTER — Encounter (HOSPITAL_COMMUNITY): Payer: Self-pay

## 2014-07-22 DIAGNOSIS — Z01818 Encounter for other preprocedural examination: Secondary | ICD-10-CM | POA: Insufficient documentation

## 2014-07-22 HISTORY — DX: Other specified health status: Z78.9

## 2014-07-22 NOTE — Pre-Procedure Instructions (Signed)
Per Elizabeth StarlingAdrienne in office pt had normal CBC on 10/19- no need to repaet CBC for surgery.

## 2014-07-22 NOTE — Patient Instructions (Signed)
Your procedure is scheduled on:07/30/14  Enter through the Main Entrance at :8am Pick up desk phone and dial [phone] and inform us of your arrival.  Please call [phone] if you have any problems the morning of surgery.  Remember: Do not eat food or drink liquids, including water, after midnight:Tuesday 07/29/14   You may brush your teeth the morning of surgery.   DO NOT wear jewelry, eye make-up, lipstick,body lotion, or dark fingernail polish.  (Polished toes are ok) You may wear deodorant.  If you are to be admitted after surgery, leave suitcase in car until your room has been assigned. Patients discharged on the day of surgery will not be allowed to drive home. Wear loose fitting, comfortable clothes for your ride home.

## 2014-07-28 ENCOUNTER — Encounter (HOSPITAL_COMMUNITY): Payer: Self-pay

## 2014-07-29 NOTE — H&P (Signed)
  Admission History and Physical Exam for a Gynecology Patient  [name] is a 43 y.o. female, [id number], who presents for a vaginal hysterectomy.  The patient complains of irregular menstrual bleeding.  An ultrasound was performed and no lesions were seen. The uterine wall was said to be thickened.  Her TSH and prolactin are normal.  Her hemoglobin was 12.4 in the office.  Her endometrial biopsy was benign.  Her most recent Pap smear showed ASCUS H.  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  Colposcopy and biopsies were performed.  The transition zone was completely seen. The biopsies showed CIN 2. [facility].  OB History    Gravida Para Term Preterm AB TAB SAB Ectopic Multiple Living   4 2 2  0 2 0 2 0 0 2      Past Medical History  Diagnosis Date  . No pertinent past medical history   . Medical history non-contributory     No prescriptions prior to admission    Past Surgical History  Procedure Laterality Date  . Wisdom tooth extraction    . Laparoscopic tubal ligation  10/22/2011    Procedure: LAPAROSCOPIC TUBAL LIGATION;  Surgeon: Janine LimboArthur V Lynne Righi, MD;  Location: [facility];  Service: Gynecology;  Laterality: Bilateral;    Allergies  Allergen Reactions  . Aspirin Hives  . Iodine Hives    Family History: family history is not on file.  Social History:  reports that she has never smoked. She has never used smokeless tobacco. She reports that she does not drink alcohol or use illicit drugs.  Review of systems: See HPI.  Admission Physical Exam:    BMI = 32.4.  HEENT:                 Within normal limits Chest:                   Clear Heart:                    Regular rate and rhythm Breasts:                No masses, skin changes, bleeding, or discharge present Abdomen:             Nontender, no masses Extremities:          Grossly normal Neurologic exam: Grossly  normal  Pelvic exam:  External genitalia: normal general appearance Vaginal: normal without tenderness, induration or masses Cervix: normal appearance Adnexa: normal bimanual exam Uterus: Upper limits normal size Rectal: good sphincter tone and no masses   CBC    Component Value Date/Time   WBC 5.1 10/17/2011 1500   RBC 4.52 10/17/2011 1500   HGB 12.4 10/17/2011 1500   HCT 37.2 10/17/2011 1500   PLT 273 10/17/2011 1500   MCV 82.3 10/17/2011 1500   MCH 27.4 10/17/2011 1500   MCHC 33.3 10/17/2011 1500   RDW 13.0 10/17/2011 1500    Assessment:  Irregular Uterine Bleeding  CIN 2  HR HPV  Thickened uterus on US (questionable adenomyosis)  Overweight  Plan:  Vaginal Hysterectomy   Keisean Skowron V 07/29/2014

## 2014-07-30 ENCOUNTER — Ambulatory Visit (HOSPITAL_COMMUNITY): Payer: 59 | Admitting: Anesthesiology

## 2014-07-30 ENCOUNTER — Encounter (HOSPITAL_COMMUNITY)
Admission: RE | Disposition: A | Discharge status: Home / Self Care | Payer: Self-pay | Source: Ambulatory Visit | Attending: Obstetrics and Gynecology

## 2014-07-30 ENCOUNTER — Ambulatory Visit (HOSPITAL_COMMUNITY)
Admission: RE | Admit: 2014-07-30 | Discharge: 2014-07-31 | Disposition: A | Discharge status: Home / Self Care | Payer: 59 | Source: Ambulatory Visit | Attending: Obstetrics and Gynecology | Admitting: Obstetrics and Gynecology

## 2014-07-30 DIAGNOSIS — N926 Irregular menstruation, unspecified: Secondary | ICD-10-CM | POA: Diagnosis present

## 2014-07-30 DIAGNOSIS — A630 Anogenital (venereal) warts: Secondary | ICD-10-CM | POA: Diagnosis not present

## 2014-07-30 DIAGNOSIS — Z886 Allergy status to analgesic agent status: Secondary | ICD-10-CM | POA: Diagnosis not present

## 2014-07-30 DIAGNOSIS — Z224 Carrier of infections with a predominantly sexual mode of transmission: Secondary | ICD-10-CM | POA: Insufficient documentation

## 2014-07-30 DIAGNOSIS — N871 Moderate cervical dysplasia: Secondary | ICD-10-CM | POA: Diagnosis not present

## 2014-07-30 DIAGNOSIS — E663 Overweight: Secondary | ICD-10-CM | POA: Insufficient documentation

## 2014-07-30 DIAGNOSIS — Z883 Allergy status to other anti-infective agents status: Secondary | ICD-10-CM | POA: Insufficient documentation

## 2014-07-30 HISTORY — PX: BILATERAL SALPINGECTOMY: SHX5743

## 2014-07-30 HISTORY — PX: VAGINAL HYSTERECTOMY: SHX2639

## 2014-07-30 LAB — PREGNANCY, URINE: PREG TEST UR: NEGATIVE

## 2014-07-30 SURGERY — HYSTERECTOMY, VAGINAL
Anesthesia: General | Site: Vagina

## 2014-07-30 MED ORDER — ONDANSETRON HCL 4 MG/2ML IJ SOLN: 4.0000 mg | Freq: Four times a day (QID) | INTRAMUSCULAR | Status: DC | PRN

## 2014-07-30 MED ORDER — DEXAMETHASONE SODIUM PHOSPHATE 4 MG/ML IJ SOLN
INTRAMUSCULAR | Status: AC
  Filled 2014-07-30: qty 1

## 2014-07-30 MED ORDER — LIDOCAINE HCL (CARDIAC) 20 MG/ML IV SOLN
INTRAVENOUS | Status: DC | PRN
  Administered 2014-07-30: 50 mg via INTRAVENOUS

## 2014-07-30 MED ORDER — DEXTROSE IN LACTATED RINGERS 5 % IV SOLN
INTRAVENOUS | Status: DC
  Administered 2014-07-30 – 2014-07-31 (×2): via INTRAVENOUS

## 2014-07-30 MED ORDER — SIMETHICONE 80 MG PO CHEW
80.0000 mg | CHEWABLE_TABLET | Freq: Four times a day (QID) | ORAL | Status: DC | PRN
  Administered 2014-07-31: 80 mg via ORAL
  Filled 2014-07-30: qty 1

## 2014-07-30 MED ORDER — FENTANYL CITRATE 0.05 MG/ML IJ SOLN
INTRAMUSCULAR | Status: AC
  Filled 2014-07-30: qty 2

## 2014-07-30 MED ORDER — NALOXONE HCL 0.4 MG/ML IJ SOLN: 0.4000 mg | INTRAMUSCULAR | Status: DC | PRN

## 2014-07-30 MED ORDER — IBUPROFEN 800 MG PO TABS
800.0000 mg | ORAL_TABLET | Freq: Three times a day (TID) | ORAL | Status: DC | PRN
  Administered 2014-07-31: 800 mg via ORAL
  Filled 2014-07-30: qty 1

## 2014-07-30 MED ORDER — ACETAMINOPHEN 160 MG/5ML PO SOLN
960.0000 mg | Freq: Four times a day (QID) | ORAL | Status: DC | PRN
  Administered 2014-07-30: 960 mg via ORAL

## 2014-07-30 MED ORDER — KETOROLAC TROMETHAMINE 30 MG/ML IJ SOLN: 30.0000 mg | Freq: Four times a day (QID) | INTRAMUSCULAR | Status: DC

## 2014-07-30 MED ORDER — ACETAMINOPHEN 160 MG/5ML PO SOLN
ORAL | Status: AC
  Administered 2014-07-30: 960 mg via ORAL
  Filled 2014-07-30: qty 40.6

## 2014-07-30 MED ORDER — SODIUM CHLORIDE 0.9 % IJ SOLN: 9.0000 mL | INTRAMUSCULAR | Status: DC | PRN

## 2014-07-30 MED ORDER — BUPIVACAINE-EPINEPHRINE 0.5% -1:200000 IJ SOLN
INTRAMUSCULAR | Status: DC | PRN
  Administered 2014-07-30: 20 mL

## 2014-07-30 MED ORDER — LACTATED RINGERS IV SOLN
INTRAVENOUS | Status: DC
  Administered 2014-07-30 (×2): via INTRAVENOUS

## 2014-07-30 MED ORDER — DIPHENHYDRAMINE HCL 50 MG/ML IJ SOLN: 12.5000 mg | Freq: Four times a day (QID) | INTRAMUSCULAR | Status: DC | PRN

## 2014-07-30 MED ORDER — OXYCODONE-ACETAMINOPHEN 5-325 MG PO TABS
1.0000 | ORAL_TABLET | ORAL | Status: DC | PRN
  Administered 2014-07-31: 1 via ORAL
  Filled 2014-07-30: qty 1

## 2014-07-30 MED ORDER — DOCUSATE SODIUM 100 MG PO CAPS
100.0000 mg | ORAL_CAPSULE | Freq: Two times a day (BID) | ORAL | Status: DC
  Administered 2014-07-30 – 2014-07-31 (×2): 100 mg via ORAL
  Filled 2014-07-30 (×2): qty 1

## 2014-07-30 MED ORDER — ONDANSETRON HCL 4 MG/2ML IJ SOLN
4.0000 mg | Freq: Four times a day (QID) | INTRAMUSCULAR | Status: DC | PRN
  Administered 2014-07-30: 4 mg via INTRAVENOUS
  Filled 2014-07-30: qty 2

## 2014-07-30 MED ORDER — FENTANYL CITRATE 0.05 MG/ML IJ SOLN
INTRAMUSCULAR | Status: DC | PRN
  Administered 2014-07-30: 50 ug via INTRAVENOUS
  Administered 2014-07-30: 100 ug via INTRAVENOUS
  Administered 2014-07-30: 50 ug via INTRAVENOUS

## 2014-07-30 MED ORDER — MIDAZOLAM HCL 2 MG/2ML IJ SOLN
INTRAMUSCULAR | Status: DC | PRN
  Administered 2014-07-30: 2 mg via INTRAVENOUS

## 2014-07-30 MED ORDER — CEFAZOLIN SODIUM-DEXTROSE 2-3 GM-% IV SOLR
2.0000 g | INTRAVENOUS | Status: AC
  Administered 2014-07-30: 2 g via INTRAVENOUS

## 2014-07-30 MED ORDER — PROPOFOL 10 MG/ML IV EMUL
INTRAVENOUS | Status: AC
  Filled 2014-07-30: qty 20

## 2014-07-30 MED ORDER — SCOPOLAMINE 1 MG/3DAYS TD PT72
MEDICATED_PATCH | TRANSDERMAL | Status: AC
  Administered 2014-07-30: 1.5 mg via TRANSDERMAL
  Filled 2014-07-30: qty 1

## 2014-07-30 MED ORDER — PROPOFOL 10 MG/ML IV BOLUS
INTRAVENOUS | Status: DC | PRN
  Administered 2014-07-30: 200 mg via INTRAVENOUS

## 2014-07-30 MED ORDER — ONDANSETRON HCL 4 MG/2ML IJ SOLN
INTRAMUSCULAR | Status: AC
  Filled 2014-07-30: qty 2

## 2014-07-30 MED ORDER — HYDROMORPHONE HCL 1 MG/ML IJ SOLN
INTRAMUSCULAR | Status: AC
  Filled 2014-07-30: qty 1

## 2014-07-30 MED ORDER — ONDANSETRON HCL 4 MG/2ML IJ SOLN
INTRAMUSCULAR | Status: DC | PRN
  Administered 2014-07-30: 4 mg via INTRAVENOUS

## 2014-07-30 MED ORDER — CEFAZOLIN SODIUM-DEXTROSE 2-3 GM-% IV SOLR
INTRAVENOUS | Status: AC
  Filled 2014-07-30: qty 50

## 2014-07-30 MED ORDER — BUPIVACAINE-EPINEPHRINE (PF) 0.5% -1:200000 IJ SOLN
INTRAMUSCULAR | Status: AC
  Filled 2014-07-30: qty 30

## 2014-07-30 MED ORDER — ONDANSETRON HCL 4 MG PO TABS: 4.0000 mg | ORAL_TABLET | Freq: Four times a day (QID) | ORAL | Status: DC | PRN

## 2014-07-30 MED ORDER — SCOPOLAMINE 1 MG/3DAYS TD PT72
1.0000 | MEDICATED_PATCH | TRANSDERMAL | Status: DC
  Administered 2014-07-30: 1.5 mg via TRANSDERMAL

## 2014-07-30 MED ORDER — KETOROLAC TROMETHAMINE 30 MG/ML IJ SOLN
30.0000 mg | Freq: Four times a day (QID) | INTRAMUSCULAR | Status: DC
  Administered 2014-07-30 – 2014-07-31 (×3): 30 mg via INTRAVENOUS
  Filled 2014-07-30 (×3): qty 1

## 2014-07-30 MED ORDER — HYDROMORPHONE 0.3 MG/ML IV SOLN
INTRAVENOUS | Status: DC
  Administered 2014-07-30: 0.3 mg via INTRAVENOUS
  Administered 2014-07-30: 14:00:00 via INTRAVENOUS
  Administered 2014-07-31: 2 mL via INTRAVENOUS
  Administered 2014-07-31: 0.6 mg via INTRAVENOUS
  Filled 2014-07-30: qty 25

## 2014-07-30 MED ORDER — LIDOCAINE HCL (CARDIAC) 20 MG/ML IV SOLN
INTRAVENOUS | Status: AC
Start: 2014-07-30 — End: 2014-07-30
  Filled 2014-07-30: qty 5

## 2014-07-30 MED ORDER — DEXAMETHASONE SODIUM PHOSPHATE 10 MG/ML IJ SOLN
INTRAMUSCULAR | Status: DC | PRN
  Administered 2014-07-30: 4 mg via INTRAVENOUS

## 2014-07-30 MED ORDER — GLYCOPYRROLATE 0.2 MG/ML IJ SOLN
INTRAMUSCULAR | Status: DC | PRN
  Administered 2014-07-30: 0.2 mg via INTRAVENOUS

## 2014-07-30 MED ORDER — HYDROMORPHONE HCL 1 MG/ML IJ SOLN
0.2500 mg | INTRAMUSCULAR | Status: DC | PRN
  Administered 2014-07-30 (×2): 0.5 mg via INTRAVENOUS

## 2014-07-30 MED ORDER — 0.9 % SODIUM CHLORIDE (POUR BTL) OPTIME
TOPICAL | Status: DC | PRN
  Administered 2014-07-30: 1000 mL

## 2014-07-30 MED ORDER — MENTHOL 3 MG MT LOZG
1.0000 | LOZENGE | OROMUCOSAL | Status: DC | PRN
  Administered 2014-07-30: 3 mg via ORAL
  Filled 2014-07-30: qty 9

## 2014-07-30 MED ORDER — GLYCOPYRROLATE 0.2 MG/ML IJ SOLN
INTRAMUSCULAR | Status: AC
  Filled 2014-07-30: qty 1

## 2014-07-30 MED ORDER — VASOPRESSIN 20 UNIT/ML IV SOLN
INTRAVENOUS | Status: AC
  Filled 2014-07-30: qty 1

## 2014-07-30 MED ORDER — MIDAZOLAM HCL 2 MG/2ML IJ SOLN
INTRAMUSCULAR | Status: AC
  Filled 2014-07-30: qty 2

## 2014-07-30 MED ORDER — DIPHENHYDRAMINE HCL 12.5 MG/5ML PO ELIX: 12.5000 mg | ORAL_SOLUTION | Freq: Four times a day (QID) | ORAL | Status: DC | PRN

## 2014-07-30 MED ORDER — HYDROMORPHONE HCL 1 MG/ML IJ SOLN
INTRAMUSCULAR | Status: DC | PRN
  Administered 2014-07-30: 1 mg via INTRAVENOUS

## 2014-07-30 MED ORDER — SODIUM CHLORIDE 0.9 % IJ SOLN
INTRAMUSCULAR | Status: AC
  Filled 2014-07-30: qty 100

## 2014-07-30 MED ORDER — KETOROLAC TROMETHAMINE 30 MG/ML IJ SOLN
INTRAMUSCULAR | Status: DC | PRN
  Administered 2014-07-30: 30 mg via INTRAVENOUS

## 2014-07-30 SURGICAL SUPPLY — 25 items
CANISTER SUCT 3000ML (MISCELLANEOUS) ×4 IMPLANT
CLOTH BEACON ORANGE TIMEOUT ST (SAFETY) ×4 IMPLANT
CONT PATH 16OZ SNAP LID 3702 (MISCELLANEOUS) ×4 IMPLANT
DECANTER SPIKE VIAL GLASS SM (MISCELLANEOUS) ×4 IMPLANT
DRAPE SHEET LG 3/4 BI-LAMINATE (DRAPES) ×4 IMPLANT
DRSG TELFA 3X8 NADH (GAUZE/BANDAGES/DRESSINGS) ×4 IMPLANT
GLOVE BIOGEL PI IND STRL 6.5 (GLOVE) ×2 IMPLANT
GLOVE BIOGEL PI IND STRL 8.5 (GLOVE) ×2 IMPLANT
GLOVE BIOGEL PI INDICATOR 6.5 (GLOVE) ×2
GLOVE BIOGEL PI INDICATOR 8.5 (GLOVE) ×2
GLOVE ECLIPSE 8.0 STRL XLNG CF (GLOVE) ×8 IMPLANT
GOWN STRL REUS W/TWL LRG LVL3 (GOWN DISPOSABLE) ×16 IMPLANT
NEEDLE MAYO .5 CIRCLE (NEEDLE) ×4 IMPLANT
NS IRRIG 1000ML POUR BTL (IV SOLUTION) ×4 IMPLANT
PACK VAGINAL WOMENS (CUSTOM PROCEDURE TRAY) ×4 IMPLANT
PAD OB MATERNITY 4.3X12.25 (PERSONAL CARE ITEMS) ×4 IMPLANT
SUT VIC AB 0 CT1 18XCR BRD8 (SUTURE) ×6 IMPLANT
SUT VIC AB 0 CT1 27 (SUTURE) ×2
SUT VIC AB 0 CT1 27XBRD ANBCTR (SUTURE) ×2 IMPLANT
SUT VIC AB 0 CT1 8-18 (SUTURE) ×6
SUT VICRYL 0 TIES 12 18 (SUTURE) ×4 IMPLANT
SYR TB 1ML 25GX5/8 (SYRINGE) ×4 IMPLANT
TOWEL OR 17X24 6PK STRL BLUE (TOWEL DISPOSABLE) ×8 IMPLANT
TRAY FOLEY CATH 14FR (SET/KITS/TRAYS/PACK) ×4 IMPLANT
WATER STERILE IRR 1000ML POUR (IV SOLUTION) ×4 IMPLANT

## 2014-07-30 NOTE — Plan of Care (Signed)
Problem: Consults Goal: Skin Care Protocol Initiated - if Braden Score 18 or less If consults are not indicated, leave blank or document N/A  Outcome: Completed/Met Date Met:  07/30/14  Problem: Phase I Progression Outcomes Goal: Admission history reviewed Outcome: Completed/Met Date Met:  07/30/14 Goal: Dangle/OOB as tolerated per MD order Outcome: Completed/Met Date Met:  07/30/14

## 2014-07-30 NOTE — H&P (Signed)
The patient was interviewed and examined today.  The previously documented history and physical examination was reviewed. There are no changes. The operative procedure was reviewed. The risks and benefits were outlined again. The specific risks include, but are not limited to, anesthetic complications, bleeding, infections, and possible damage to the surrounding organs. The patient's questions were answered.  We are ready to proceed as outlined. The likelihood of the patient achieving the goals of this procedure is very likely.   BP 113/73 mmHg  Pulse 69  Temp(Src) 98.2 F (36.8 C) (Oral)  Resp 16  SpO2 100%  CBC    Component Value Date/Time   WBC 5.1 10/17/2011 1500   RBC 4.52 10/17/2011 1500   HGB 12.4 10/17/2011 1500   HCT 37.2 10/17/2011 1500   PLT 273 10/17/2011 1500   MCV 82.3 10/17/2011 1500   MCH 27.4 10/17/2011 1500   MCHC 33.3 10/17/2011 1500   RDW 13.0 10/17/2011 1500    Results for orders placed or performed during the hospital encounter of 07/30/14 (from the past 24 hour(s))  Pregnancy, urine     Status: None   Collection Time: 07/30/14  8:25 AM  Result Value Ref Range   Preg Test, Ur NEGATIVE NEGATIVE   Leonard SchwartzArthur Vernon Atlas Crossland, M.D.

## 2014-07-30 NOTE — Anesthesia Postprocedure Evaluation (Signed)
  Anesthesia Post-op Note  Anesthesia Post Note  Patient: Elizabeth Andrews  Procedure(s) Performed: Procedure(s) (LRB): HYSTERECTOMY VAGINAL (N/A) BILATERAL SALPINGECTOMY (Bilateral)  Anesthesia type: General  Patient location: PACU  Post pain: Pain level controlled  Post assessment: Post-op Vital signs reviewed  Last Vitals:  Filed Vitals:   07/30/14 1315  BP: 115/75  Pulse: 81  Temp:   Resp: 18    Post vital signs: Reviewed  Level of consciousness: sedated  Complications: No apparent anesthesia complications

## 2014-07-30 NOTE — Progress Notes (Signed)
Day of Surgery Procedure(s) (LRB): HYSTERECTOMY VAGINAL (N/A) BILATERAL SALPINGECTOMY (Bilateral)  Subjective: Patient reports nausea.    Objective: I have reviewed patient's vital signs.  General: no distress Resp: clear to auscultation bilaterally Cardio: regular rate and rhythm, S1, S2 normal, no murmur, click, rub or gallop GI: Appropriately tender Extremities: Homans sign is negative, no sign of DVT Vaginal Bleeding: minimal  Assessment: s/p Procedure(s): HYSTERECTOMY VAGINAL BILATERAL SALPINGECTOMY: stable  Plan: Advance diet Encourage ambulation  LOS: 0 days    Elizabeth Andrews V 07/30/2014, 5:07 PM

## 2014-07-30 NOTE — Op Note (Signed)
OPERATIVE NOTE  Loyal JacobsonZshamaica B Stieber  DOB:    09/07/1971  MRN:    [id number]  CSN:    [id number]  Date of Surgery:  07/30/2014  Preoperative Diagnosis:  Irregular menstrual periods  CIN-2  Overweight  Postoperative Diagnosis:  same  Procedure:  Vaginal hysterectomy  Bilateral salpingectomy  Surgeon:  Leonard SchwartzArthur Vernon Tremane Spurgeon, M.D.  Assistant:  Jaymes GraffNaima Dillard, M.D.  Anesthetic:  General  Disposition:  The patient presents with the above-mentioned diagnosis. She understands the indications for surgical procedure.  She also understands the alternative treatment options. She accepts the risk of, but not limited to, anesthetic complications, bleeding, infections, and possible damage to the surrounding organs.  Findings:  The uterus was upper limits normal size. The fallopian tubes were normal except for defects from prior sterilization. Her ovaries appeared normal bilaterally.  Procedure:  The patient was taken to the operating room where a general anesthetic was given. The patient's lower abdomen, perineum, and vagina were prepped with multiple layers of DuraPrep. A Foley catheter was placed in the bladder. The patient was sterilely draped. An examination under anesthesia was performed. Operative findings are mentioned above. The cervix was injected with half percent Marcaine with epinephrine. A circumferential incision was made around the cervix. The vaginal mucosa was advanced anteriorly and posteriorly. The anterior cul-de-sac and in the posterior cul-de-sac were sharply entered. Alternating from right to left the uterosacral ligaments, paracervical tissues, parametrial tissues, and uterine arteries were clamped, cut, sutured, and tied securely. The upper pedicles were then clamped and cut. The uterus was removed from the operative field. The upper pedicles were secured using free ties and then suture ligatures. The left fallopian tube was identified. The mesosalpinx was  clamped and cut removing the entire tube. Interrupted sutures were used for hemostasis. An identical procedure was carried out on the opposite side. Hemostasis was confirmed. The sutures attached to the uterosacral ligaments were brought out through the vaginal angles and then tied securely. A McCall culdoplasty suture was placed in the posterior cul-de-sac incorporating the uterosacral ligaments bilaterally and the posterior peritoneum. A final check was made for hemostasis and again hemostasis was confirmed. The vaginal cuff was closed using figure-of-eight sutures incorporating the anterior vaginal mucosa, the anterior peritoneum, posterior peritoneum, and the posterior vaginal mucosa. The McCall culdoplasty suture was tied securely and the apex of the vagina was noted to elevate into the midpelvis. The patient tolerated her procedure well. She was awakened from her anesthetic without difficulty and then transported to the recovery room in stable condition. Sponge, needle, and instrument counts were correct on 2 occasions. The estimated blood loss was 150 cc's. 0 Vicryl is the suture material used throughout the procedure. The uterus and tubes were sent to pathology.  Leonard SchwartzArthur Vernon Lyndel Sarate, M.D.  07/30/2014

## 2014-07-30 NOTE — Anesthesia Preprocedure Evaluation (Signed)

## 2014-07-30 NOTE — Transfer of Care (Signed)
Immediate Anesthesia Transfer of Care Note  Patient: Elizabeth Andrews  Procedure(s) Performed: Procedure(s): HYSTERECTOMY VAGINAL (N/A) BILATERAL SALPINGECTOMY (Bilateral)  Patient Location: PACU  Anesthesia Type:General  Level of Consciousness: awake, alert  and oriented  Airway & Oxygen Therapy: Patient Spontanous Breathing and Patient connected to nasal cannula oxygen  Post-op Assessment: Report given to PACU RN and Post -op Vital signs reviewed and stable  Post vital signs: Reviewed and stable  Complications: No apparent anesthesia complications

## 2014-07-31 ENCOUNTER — Encounter (HOSPITAL_COMMUNITY): Payer: Self-pay | Admitting: Obstetrics and Gynecology

## 2014-07-31 DIAGNOSIS — N871 Moderate cervical dysplasia: Secondary | ICD-10-CM | POA: Diagnosis not present

## 2014-07-31 LAB — CBC
HEMATOCRIT: 30.9 % — AB (ref 36.0–46.0)
HEMOGLOBIN: 10.3 g/dL — AB (ref 12.0–15.0)
MCH: 26.5 pg (ref 26.0–34.0)
MCHC: 33.3 g/dL (ref 30.0–36.0)
MCV: 79.6 fL (ref 78.0–100.0)
Platelets: 267 10*3/uL (ref 150–400)
RBC: 3.88 MIL/uL (ref 3.87–5.11)
RDW: 13.7 % (ref 11.5–15.5)
WBC: 7.7 10*3/uL (ref 4.0–10.5)

## 2014-07-31 MED ORDER — IBUPROFEN 800 MG PO TABS: ORAL_TABLET | ORAL | Status: DC

## 2014-07-31 MED ORDER — OXYCODONE-ACETAMINOPHEN 5-325 MG PO TABS: 1.0000 | ORAL_TABLET | ORAL | Status: DC | PRN

## 2014-07-31 MED ORDER — ONDANSETRON HCL 4 MG PO TABS: 4.0000 mg | ORAL_TABLET | Freq: Four times a day (QID) | ORAL | Status: DC | PRN

## 2014-07-31 NOTE — Anesthesia Postprocedure Evaluation (Signed)
  Anesthesia Post-op Note  Patient: Elizabeth Andrews  Procedure(s) Performed: Procedure(s): HYSTERECTOMY VAGINAL (N/A) BILATERAL SALPINGECTOMY (Bilateral)  Patient Location: PACU and Women's Unit  Anesthesia Type:General  Level of Consciousness: awake, alert  and oriented  Airway and Oxygen Therapy: Patient Spontanous Breathing  Post-op Pain: mild  Post-op Assessment: Post-op Vital signs reviewed, Patient's Cardiovascular Status Stable, Respiratory Function Stable, No signs of Nausea or vomiting, Adequate PO intake and Pain level controlled  Post-op Vital Signs: Reviewed and stable  Last Vitals:  Filed Vitals:   07/31/14 0537  BP: 91/47  Pulse: 65  Temp: 37.4 C  Resp: 18    Complications: No apparent anesthesia complications

## 2014-07-31 NOTE — Progress Notes (Signed)
Pt ambulated out teaching complete  

## 2014-07-31 NOTE — Discharge Instructions (Signed)
Call [facility] @ [phone] if:  You have a temperature greater than or equal to 100.4 degrees Farenheit orally You have pain that is not made better by the pain medication given and taken as directed You have excessive bleeding or problems urinating  Take Colace (Docusate Sodium/Stool Softener) 100 mg 2-3 times daily while taking narcotic pain medicine to avoid constipation or until bowel movements are regular.  You may drive after 2 weeks You may walk up steps You may shower   You may resume a regular diet Do not lift over 15 pounds for 6 weeks Avoid anything in vagina for 6 weeks (or until after your post-operative visit)  Keep follow up appointment with Dr. Stefano GaulStringer on September 10, 2014 at 10:15 a.m.

## 2014-07-31 NOTE — Plan of Care (Signed)
Problem: Phase I Progression Outcomes Goal: Pain controlled with appropriate interventions Outcome: Completed/Met Date Met:  07/31/14 Goal: VS, stable, temp < 100.4 degrees F Outcome: Completed/Met Date Met:  07/31/14 Goal: I & O every 4 hrs or as ordered Outcome: Completed/Met Date Met:  07/31/14 Goal: IS, TCDB as ordered Outcome: Completed/Met Date Met:  07/31/14

## 2014-07-31 NOTE — Discharge Summary (Signed)
  Physician Discharge Summary  Patient ID: Elizabeth JacobsonZshamaica B Andrews MRN: [id number] DOB/AGE: 41/11/1970 41 y.o.  Admit date: 07/30/2014 Discharge date: 07/31/2014   Discharge Diagnoses: Irregular Uterine Bleeding,  CIN-2 and Possible Adenomyosis Active Problems:   Irregular periods   Operation: Total Vaginal Hysterectomy with Bilateral Salpingectomy   Discharged Condition: Good  Hospital Course: On the date of admission the patient underwent the aforementioned procedures and tolerated them well.  Post operative course was unremarkable with the patient tolerating a post operative hemoglobin of 10.3  (pre-op hemoglobin-11.2) and resuming bowel with bladder function by  post operative day #1.  Having received the maximum benefit of her hospital stay, the patient was discharged home on post operative day #1.  Disposition: 01-Home or Self Care  Discharge Medications:    Medication List    TAKE these medications        ibuprofen 800 MG tablet  Commonly known as:  ADVIL,MOTRIN  1  po pc every 6 hours for 5 days then prn-pain     ondansetron 4 MG tablet  Commonly known as:  ZOFRAN  Take 1 tablet (4 mg total) by mouth every 6 (six) hours as needed for nausea.     oxyCODONE-acetaminophen 5-325 MG per tablet  Commonly known as:  PERCOCET/ROXICET  Take 1-2 tablets by mouth every 4 (four) hours as needed for severe pain (moderate to severe pain (when tolerating fluids)).          Follow-up: Dr. Janine LimboArthur V. Stringer on September 10, 2014 at 10:15 a.m.   SignedHenreitta Leber: Charidy Cappelletti, PA-C 07/31/2014, 7:44 AM

## 2014-07-31 NOTE — Progress Notes (Signed)
Elizabeth JacobsonZshamaica B Andrews is [age] y.o.  [id number]  Post Op Date # 1:  TVH/BS Subjective: Patient is Doing well postoperatively. Patient has Pain is controlled with current analgesics. Medications being used: prescription NSAID's including ibuprofen (Motrin) and narcotic analgesics including Percocet.  Has had transient brief dizziness with ambulation. Tolerating liquids and ordering a regular diet.   Hasn't voided yet but has the urge.  Ambulating in the halls without difficulty.   Objective: Vital signs in last 24 hours: Temp:  [98.2 F (36.8 C)-99.4 F (37.4 C)] 99.4 F (37.4 C) (11/05 0537) Pulse Rate:  [65-87] 65 (11/05 0537) Resp:  [8-20] 18 (11/05 0537) BP: (91-125)/(40-75) 91/47 mmHg (11/05 0537) SpO2:  [96 %-100 %] 99 % (11/05 0537)  Intake/Output from previous day: 11/04 0701 - 11/05 0700 In: 4076.2 [I.V.:4076.2] Out: 2410 [Urine:1860] Intake/Output this shift:    Recent Labs Lab 07/31/14 0531  WBC 7.7  HGB 10.3*  HCT 30.9*  PLT 267    No results for input(s): NA, K, CL, CO2, BUN, CREATININE, CALCIUM, PROT, BILITOT, ALKPHOS, ALT, AST, GLUCOSE in the last 168 hours.  Invalid input(s): LABALBU  EXAM: General: alert, cooperative and no distress Resp: clear to auscultation bilaterally Cardio: regular rate and rhythm, S1, S2 normal, no murmur, click, rub or gallop GI: Bowel sounds present, soft Extremities: No calf tenderness and negative Homan's Vaginal Bleeding: none   Assessment: s/p Procedure(s): HYSTERECTOMY VAGINAL BILATERAL SALPINGECTOMY: stable, progressing well and anemia  Plan: Advance diet Encourage ambulation Discharge home  LOS: 1 day    POWELL,ELMIRA, PA-C 07/31/2014 7:33 AM  PROGRESS NOTE  CBC    Component Value Date/Time   WBC 7.7 07/31/2014 0531   RBC 3.88 07/31/2014 0531   HGB 10.3* 07/31/2014 0531   HCT 30.9* 07/31/2014 0531   PLT 267 07/31/2014 0531   MCV 79.6 07/31/2014 0531   MCH 26.5 07/31/2014 0531   MCHC 33.3 07/31/2014 0531    RDW 13.7 07/31/2014 0531      I have reviewed the patient's vital signs, labs, and notes. I have examined the patient. I agree with the previous note from the physician assistant.  Leonard SchwartzArthur Vernon Elizabeth Andrews, M.D. 07/31/2014

## 2014-07-31 NOTE — Addendum Note (Signed)
Addendum  created 07/31/14 [id number] by Elbert Ewingsolleen S Shalimar Mcclain, CRNA   Modules edited: Notes Section   Notes Section:  File: [id number]

## 2014-08-01 ENCOUNTER — Other Ambulatory Visit: Payer: Self-pay | Admitting: Certified Nurse Midwife

## 2014-08-01 MED ORDER — HYDROMORPHONE HCL 2 MG PO TABS: 1.0000 mg | ORAL_TABLET | ORAL | Status: DC | PRN

## 2014-08-01 MED ORDER — HYDROMORPHONE HCL 2 MG PO TABS: 2.0000 mg | ORAL_TABLET | ORAL | Status: DC | PRN

## 2014-08-04 ENCOUNTER — Other Ambulatory Visit (HOSPITAL_COMMUNITY): Payer: Self-pay | Admitting: Obstetrics and Gynecology

## 2014-08-04 DIAGNOSIS — R1030 Lower abdominal pain, unspecified: Secondary | ICD-10-CM

## 2014-08-05 ENCOUNTER — Ambulatory Visit (HOSPITAL_COMMUNITY): Admission: RE | Admit: 2014-08-05 | Payer: 59 | Source: Ambulatory Visit

## 2015-07-06 ENCOUNTER — Ambulatory Visit (INDEPENDENT_AMBULATORY_CARE_PROVIDER_SITE_OTHER): Payer: 59 | Admitting: Neurology

## 2015-07-06 ENCOUNTER — Encounter: Payer: Self-pay | Admitting: Neurology

## 2015-07-06 VITALS — BP 120/70 | HR 78 | Resp 14 | Ht 65.5 in | Wt 191.0 lb

## 2015-07-06 DIAGNOSIS — M542 Cervicalgia: Secondary | ICD-10-CM | POA: Diagnosis not present

## 2015-07-06 DIAGNOSIS — M5489 Other dorsalgia: Secondary | ICD-10-CM

## 2015-07-06 DIAGNOSIS — M79601 Pain in right arm: Secondary | ICD-10-CM | POA: Diagnosis not present

## 2015-07-06 DIAGNOSIS — G4489 Other headache syndrome: Secondary | ICD-10-CM | POA: Insufficient documentation

## 2015-07-06 DIAGNOSIS — M549 Dorsalgia, unspecified: Secondary | ICD-10-CM | POA: Insufficient documentation

## 2015-07-06 MED ORDER — NORTRIPTYLINE HCL 25 MG PO CAPS: ORAL_CAPSULE | ORAL | Status: DC

## 2015-07-06 MED ORDER — MELOXICAM 15 MG PO TABS: 15.0000 mg | ORAL_TABLET | Freq: Every day | ORAL | Status: DC

## 2015-07-06 NOTE — Patient Instructions (Signed)
Back/Neck: How to set up your workstation  Chair:  An adjustable chair to fit your height with a slight downward tilt of chair seat is helpful.  Should have good high-back support that assists with keeping the natural curves of your spine.  A "lumbar roll" or pillow at your low back can help you keep good posture.  Adjustable arm rests may decrease strain in upper body.   Keyboard:  Should be close and at elbow or just below elbow height.  Split keyboards can help decrease strain on wrist.  Wrist supports can provide mini-breaks to your wrists throughout the day.  Monitor/Screen:  Top of screen should be at eye level or slightly lower.  Good lighting is important to prevent eye strain, or headaches from glare.  Head Posture: A copy holder on either side of the monitor will help limit neck strain.  Head should not be forward.  Ears should line up with you shoulders.  Leg Posture:  Knee and hips should be bent half-way (about a 90 degree angle).  Shorter people may need something to prop their feet on, with knees bent, like a phone book.  Other Stuff: Keep the stuff (phones, pens, phonebooks, etc) that you use frequently close to you, so you're not straining to reach them.    Back Exercises The following exercises strengthen the muscles that help to support the back. They also help to keep the lower back flexible. Doing these exercises can help to prevent back pain or lessen existing pain. If you have back pain or discomfort, try doing these exercises 2-3 times each day or as told by your health care provider. When the pain goes away, do them once each day, but increase the number of times that you repeat the steps for each exercise (do more repetitions). If you do not have back pain or discomfort, do these exercises once each day or as told by your health care provider. EXERCISES Single Knee to Chest Repeat these steps 3-5 times for each leg:  Lie on your back on a firm bed or the  floor with your legs extended.  Bring one knee to your chest. Your other leg should stay extended and in contact with the floor.  Hold your knee in place by grabbing your knee or thigh.  Pull on your knee until you feel a gentle stretch in your lower back.  Hold the stretch for 10-30 seconds.  Slowly release and straighten your leg. Pelvic Tilt Repeat these steps 5-10 times:  Lie on your back on a firm bed or the floor with your legs extended.  Bend your knees so they are pointing toward the ceiling and your feet are flat on the floor.  Tighten your lower abdominal muscles to press your lower back against the floor. This motion will tilt your pelvis so your tailbone points up toward the ceiling instead of pointing to your feet or the floor.  With gentle tension and even breathing, hold this position for 5-10 seconds. Cat-Cow Repeat these steps until your lower back becomes more flexible:  Get into a hands-and-knees position on a firm surface. Keep your hands under your shoulders, and keep your knees under your hips. You may place padding under your knees for comfort.  Let your head hang down, and point your tailbone toward the floor so your lower back becomes rounded like the back of a cat.  Hold this position for 5 seconds.  Slowly lift your head and point your tailbone  up toward the ceiling so your back forms a sagging arch like the back of a cow.  Hold this position for 5 seconds. Press-Ups Repeat these steps 5-10 times:  Lie on your abdomen (face-down) on the floor.  Place your palms near your head, about shoulder-width apart.  While you keep your back as relaxed as possible and keep your hips on the floor, slowly straighten your arms to raise the top half of your body and lift your shoulders. Do not use your back muscles to raise your upper torso. You may adjust the placement of your hands to make yourself more comfortable.  Hold this position for 5 seconds while you  keep your back relaxed.  Slowly return to lying flat on the floor. Bridges Repeat these steps 10 times:  Lie on your back on a firm surface.  Bend your knees so they are pointing toward the ceiling and your feet are flat on the floor.  Tighten your buttocks muscles and lift your buttocks off of the floor until your waist is at almost the same height as your knees. You should feel the muscles working in your buttocks and the back of your thighs. If you do not feel these muscles, slide your feet 1-2 inches farther away from your buttocks.  Hold this position for 3-5 seconds.  Slowly lower your hips to the starting position, and allow your buttocks muscles to relax completely. If this exercise is too easy, try doing it with your arms crossed over your chest. Abdominal Crunches Repeat these steps 5-10 times:  Lie on your back on a firm bed or the floor with your legs extended.  Bend your knees so they are pointing toward the ceiling and your feet are flat on the floor.  Cross your arms over your chest.  Tip your chin slightly toward your chest without bending your neck.  Tighten your abdominal muscles and slowly raise your trunk (torso) high enough to lift your shoulder blades a tiny bit off of the floor. Avoid raising your torso higher than that, because it can put too much stress on your low back and it does not help to strengthen your abdominal muscles.  Slowly return to your starting position. Back Lifts Repeat these steps 5-10 times:  Lie on your abdomen (face-down) with your arms at your sides, and rest your forehead on the floor.  Tighten the muscles in your legs and your buttocks.  Slowly lift your chest off of the floor while you keep your hips pressed to the floor. Keep the back of your head in line with the curve in your back. Your eyes should be looking at the floor.  Hold this position for 3-5 seconds.  Slowly return to your starting position. SEEK MEDICAL CARE  IF:  Your back pain or discomfort gets much worse when you do an exercise.  Your back pain or discomfort does not lessen within 2 hours after you exercise. If you have any of these problems, stop doing these exercises right away. Do not do them again unless your health care provider says that you can. SEEK IMMEDIATE MEDICAL CARE IF:  You develop sudden, severe back pain. If this happens, stop doing the exercises right away. Do not do them again unless your health care provider says that you can.   This information is not intended to replace advice given to you by your health care provider. Make sure you discuss any questions you have with your health care provider.  Document Released: 10/20/2004 Document Revised: 06/03/2015 Document Reviewed: 11/06/2014 [employer] Interactive Patient Education [employer]

## 2015-07-06 NOTE — Progress Notes (Signed)
[facility]  PATIENT: Elizabeth Andrews DOB: 26-Aug-1971  REFERRING DOCTOR OR PCP:  Dr.  Kirkland Hun [phone]) SOURCE: Patient and notes from Dr. Stefano Gaul  _________________________________   HISTORICAL  CHIEF COMPLAINT:  Chief Complaint  Patient presents with  . Headaches    Sts. she rarely had headaches until the last yr. when she began having frequent h/a's.  Sts. rarely goes a week without a h/a.  Not able to identify triggers.  Sts. sometimes otc meds help.  Sometimes has severe h/a's where she has to lie down and sleep it off.  Vision with contacts in today is 20/20 bilat./fim    HISTORY OF PRESENT ILLNESS:  I had the pleasure seeing you patient, Elizabeth Andrews, at [facility] for a neurologic consultation regarding her headaches. As you know, she is a 44 year old woman who describes 2 different types of headaches.   These began about 6 months ago.  The first type of headache is predominantly in the back of the head (occiput). This headache will usually be absent or minimal in the morning but increase as the day goes on. It is achy in quality. It does not really seem to be triggered by any specific activity. Over-the-counter analgesics such as ibuprofen may help slightly but they do not get rid of the headache most of the time. Laying down also can help. It is not worsened significantly by different types of activities.   She will sometimes have nausea but no vomiting. Bright lights and noises do not bother her.  The second type of headache is more in the front of the head.   These occur more frequently and is more pounding in quality. It is not necessarily associated with any nausea or vomiting. There is no photophobia or phonophobia. Moving her head does not significantly change the headache. Over-the-counter analgesics may help the headache sometimes but not all of the time. There are no specific triggers.  She also has lower neck  pain and sometimes has an ache in the right arm.   She denies any weakness or numbness,    She denies any bladder changes.      REVIEW OF SYSTEMS: Constitutional: No fevers, chills, sweats, or change in appetite Eyes: No visual changes, double vision, eye pain Ear, nose and throat: No hearing loss, ear pain, nasal congestion, sore throat Cardiovascular: No chest pain, palpitations Respiratory: No shortness of breath at rest or with exertion.   No wheezes GastrointestinaI: No nausea, vomiting, diarrhea, abdominal pain, fecal incontinence Genitourinary: No dysuria, urinary retention or frequency.  No nocturia. Musculoskeletal: No neck pain, back pain Integumentary: No rash, pruritus, skin lesions Neurological: as above Psychiatric: No depression at this time.  No anxiety Endocrine: No palpitations, diaphoresis, change in appetite, change in weigh or increased thirst Hematologic/Lymphatic: No anemia, purpura, petechiae. Allergic/Immunologic: No itchy/runny eyes, nasal congestion, recent allergic reactions, rashes  ALLERGIES: Allergies  Allergen Reactions  . Aspirin Hives    Tolerates oral aspirin, ibuprofen, iv ketorolac.  Allergy was to Salonpas cream.  . Iodine Hives    HOME MEDICATIONS:  Current outpatient prescriptions:  .  ibuprofen (ADVIL,MOTRIN) 800 MG tablet, 1  po pc every 6 hours for 5 days then prn-pain, Disp: 30 tablet, Rfl: 1  PAST MEDICAL HISTORY: Past Medical History  Diagnosis Date  . No pertinent past medical history   . Medical history non-contributory   . Headache   . Vision abnormalities     PAST SURGICAL HISTORY: Past Surgical History  Procedure Laterality Date  . Wisdom tooth extraction    . Laparoscopic tubal ligation  10/22/2011    Procedure: LAPAROSCOPIC TUBAL LIGATION;  Surgeon: Janine Limbo, MD;  Location: [facility];  Service: Gynecology;  Laterality: Bilateral;  . Vaginal hysterectomy N/A 07/30/2014    Procedure: HYSTERECTOMY VAGINAL;   Surgeon: Kirkland Hun, MD;  Location: [facility];  Service: Gynecology;  Laterality: N/A;  . Bilateral salpingectomy Bilateral 07/30/2014    Procedure: BILATERAL SALPINGECTOMY;  Surgeon: Kirkland Hun, MD;  Location: [facility];  Service: Gynecology;  Laterality: Bilateral;    FAMILY HISTORY: History reviewed. No pertinent family history.  SOCIAL HISTORY:  Social History   Social History  . Marital Status: Married    Spouse Name: N/A  . Number of Children: N/A  . Years of Education: N/A   Occupational History  . Not on file.   Social History Main Topics  . Smoking status: Never Smoker   . Smokeless tobacco: Never Used  . Alcohol Use: No  . Drug Use: No  . Sexual Activity: Yes    Birth Control/ Protection: None   Other Topics Concern  . Not on file   Social History Narrative     PHYSICAL EXAM  Filed Vitals:   07/06/15 0910  BP: 120/70  Pulse: 78  Resp: 14  Height: 5' 5.5" (1.664 m)  Weight: 191 lb (86.637 kg)    Body mass index is 31.29 kg/(m^2).   General: The patient is well-developed and well-nourished and in no acute distress  Eyes:  Funduscopic exam shows normal optic discs and retinal vessels.  Neck: The neck is supple, no carotid bruits are noted.  The neck is nontender.  Cardiovascular: The heart has a regular rate and rhythm with a normal S1 and S2. There were no murmurs, gallops or rubs. Lungs are clear to auscultation.  Skin: Extremities are without significant edema.  Musculoskeletal:  Back is nontender  Neurologic Exam  Mental status: The patient is alert and oriented x 3 at the time of the examination. The patient has apparent normal recent and remote memory, with an apparently normal attention span and concentration ability.   Speech is normal.  Cranial nerves: Extraocular movements are full. Pupils are equal, round, and reactive to light and accomodation.  Visual fields are full.  Facial symmetry is present. There is good facial sensation  to soft touch bilaterally.Facial strength is normal.  Trapezius and sternocleidomastoid strength is normal. No dysarthria is noted.  The tongue is midline, and the patient has symmetric elevation of the soft palate. No obvious hearing deficits are noted.  Motor:  Muscle bulk is normal.   Tone is normal. Strength is  5 / 5 in all 4 extremities.   Sensory: Sensory testing is intact to pinprick, soft touch and vibration sensation in all 4 extremities.  Coordination: Cerebellar testing reveals good finger-nose-finger and heel-to-shin bilaterally.  Gait and station: Station is normal.   Gait is normal. Tandem gait is normal. Romberg is negative.   Reflexes: Deep tendon reflexes are symmetric and normal bilaterally.   Plantar responses are flexor.    DIAGNOSTIC DATA (LABS, IMAGING, TESTING) - I reviewed patient records, labs, notes, testing and imaging myself where available.  Lab Results  Component Value Date   WBC 7.7 07/31/2014   HGB 10.3* 07/31/2014   HCT 30.9* 07/31/2014   MCV 79.6 07/31/2014   PLT 267 07/31/2014       ASSESSMENT AND PLAN  Other headache syndrome  Neck pain  Midline back pain, unspecified location    In summary, Soriya Tapper is a 44 year old woman who has had headaches over the last 6 months. She appears to have a chronic tension type headache/occipital neuralgia and also headaches frontally of unclear etiology.    As her examination is normal and she is not worsenig, I will hold off imaging at this time but would reconsider if we are unable to get her headaches under control.    I will have her begin nortriptyline 25 mg by mouth daily at bedtime and increase to 50 mg by mouth daily at bedtime if tolerated and if needed. I have started meloxicam 15 mg by mouth daily. She was instructed on neck exercises with a separate handout and also advised to consider changing her workstation set up at work to help her maintain good posture.  She will return to see [place]  in 3 months but call in 1 month if she is not better.   She should call sooner if she has new or worsening symptoms.  Thank you very much for asking me to see Ms. Cavallaro for a neurologic consultation. Please let me know if I can be of further assistance with her or other patients in the future.  Richard A. Epimenio Foot, MD, PhD 07/06/2015, 9:28 AM Certified in Neurology, Clinical Neurophysiology, Sleep Medicine, Pain Medicine and Neuroimaging  [facility] [address], [address] [place], [place] [address] [phone]

## 2015-10-06 ENCOUNTER — Ambulatory Visit: Payer: 59 | Admitting: Neurology

## 2019-08-07 ENCOUNTER — Encounter: Payer: Self-pay | Admitting: Cardiovascular Disease

## 2019-08-07 ENCOUNTER — Other Ambulatory Visit: Payer: Self-pay

## 2019-08-07 ENCOUNTER — Ambulatory Visit (INDEPENDENT_AMBULATORY_CARE_PROVIDER_SITE_OTHER): Payer: 59 | Admitting: Cardiovascular Disease

## 2019-08-07 VITALS — BP 117/78 | HR 66 | Ht 65.0 in | Wt 182.0 lb

## 2019-08-07 DIAGNOSIS — R011 Cardiac murmur, unspecified: Secondary | ICD-10-CM | POA: Insufficient documentation

## 2019-08-07 NOTE — Patient Instructions (Signed)
Medication Instructions:  Your physician recommends that you continue on your current medications as directed. Please refer to the Current Medication list given to you today.  If you need a refill on your cardiac medications before your next appointment, please call your pharmacy.   Lab work: NONE  Testing/Procedures: Your physician has requested that you have an echocardiogram. Echocardiography is a painless test that uses sound waves to create images of your heart. It provides your doctor with information about the size and shape of your heart and how well your heart's chambers and valves are working. This procedure takes approximately one hour. There are no restrictions for this procedure. [place] [address]  Follow-Up: At [facility], you and your health needs are our priority.  As part of our continuing mission to provide you with exceptional heart care, we have created designated Provider Care Teams.  These Care Teams include your primary Cardiologist (physician) and Advanced Practice Providers (APPs -  Physician Assistants and Nurse Practitioners) who all work together to provide you with the care you need, when you need it. You may see Dr Gwenlyn Found or one of the following Advanced Practice Providers on your designated Care Team:    Kerin Ransom, PA-C  [place], [place]  Coletta Memos, FNP  Follow-up as needed

## 2019-08-07 NOTE — Progress Notes (Signed)
08/07/2019 Elizabeth Andrews   Feb 05, 1971  [id number]  Primary Physician Essie Hart, MD Primary Cardiologist: Runell Gess MD Nicholes Calamity, [place]  HPI:  Elizabeth Andrews is a 48 y.o. 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  She has no cardiac risk factors other than a father who had a stent in his mid 77s.  She is never had a heart attack or stroke.  She has occasional atypical chest pain.  She walks and runs on a daily basis.  Apparently her PCP auscultated a murmur and referred her here for further evaluation.   Current Meds  Medication Sig  . Multiple Vitamins-Minerals (MULTIVITAMIN WITH MINERALS) tablet Take 1 tablet by mouth daily.  . Nutritional Supplements (VITAMIN D BOOSTER PO) Take by mouth.     Allergies  Allergen Reactions  . Aspirin Hives    Tolerates oral aspirin, ibuprofen, iv ketorolac.  Allergy was to Salonpas cream.  . Iodine Hives    Social History   Socioeconomic History  . Marital status: Married    Spouse name: Not on file  . Number of children: Not on file  . Years of education: Not on file  . Highest education level: Not on file  Occupational History  . Not on file  Social Needs  . Financial resource strain: Not on file  . Food insecurity    Worry: Not on file    Inability: Not on file  . Transportation needs    Medical: Not on file    Non-medical: Not on file  Tobacco Use  . Smoking status: Never Smoker  . Smokeless tobacco: Never Used  Substance and Sexual Activity  . Alcohol use: No  . Drug use: No  . Sexual activity: Yes    Birth control/protection: None  Lifestyle  . Physical activity    Days per week: Not on file    Minutes per session: Not on file  . Stress: Not on file  Relationships  . Social Musician on phone: Not on file    Gets together: Not on file    Attends religious service: Not on file    Active  member of club or organization: Not on file    Attends meetings of clubs or organizations: Not on file    Relationship status: Not on file  . Intimate partner violence    Fear of current or ex partner: Not on file    Emotionally abused: Not on file    Physically abused: Not on file    Forced sexual activity: Not on file  Other Topics Concern  . Not on file  Social History Narrative  . Not on file     Review of Systems: General: negative for chills, fever, night sweats or weight changes.  Cardiovascular: negative for chest pain, dyspnea on exertion, edema, orthopnea, palpitations, paroxysmal nocturnal dyspnea or shortness of breath Dermatological: negative for rash Respiratory: negative for cough or wheezing Urologic: negative for hematuria Abdominal: negative for nausea, vomiting, diarrhea, bright red blood per rectum, melena, or hematemesis Neurologic: negative for visual changes, syncope, or dizziness All other systems reviewed and are otherwise negative except as noted above.    Blood pressure 117/78, pulse 66, height 5\' 5"  (1.651 m), weight 182 lb (82.6 kg), last menstrual period 07/19/2014, SpO2 100 %.  General appearance: alert and no distress Neck: no adenopathy, no carotid bruit,  no JVD, supple, symmetrical, trachea midline and thyroid not enlarged, symmetric, no tenderness/mass/nodules Lungs: clear to auscultation bilaterally Heart: regular rate and rhythm, S1, S2 normal, no murmur, click, rub or gallop Extremities: extremities normal, atraumatic, no cyanosis or edema Pulses: 2+ and symmetric Skin: Skin color, texture, turgor normal. No rashes or lesions Neurologic: Alert and oriented X 3, normal strength and tone. Normal symmetric reflexes. Normal coordination and gait  EKG sinus rhythm at 64 with RSR prime in lead V1 V2 consistent with a RV conduction delay and septal Q waves with low limb voltage.  I performed personally reviewed this EKG.  ASSESSMENT AND PLAN:    Murmur, cardiac Ms. Bouchie was referred to me by her PCP, Dr. Alwyn Pea for evaluation of an auscultated murmur.  On exam today I could not hear a murmur.  I am to get a 2D echo to further evaluate.      Lorretta Harp MD FACP,FACC,FAHA, [facility] 08/07/2019 4:56 PM

## 2019-08-07 NOTE — Assessment & Plan Note (Signed)
Elizabeth Andrews was referred to me by her PCP, Dr. Alwyn Pea for evaluation of an auscultated murmur.  On exam today I could not hear a murmur.  I am to get a 2D echo to further evaluate.

## 2019-08-19 ENCOUNTER — Ambulatory Visit (HOSPITAL_COMMUNITY): Payer: 59 | Attending: Cardiovascular Disease

## 2019-08-19 ENCOUNTER — Other Ambulatory Visit: Payer: Self-pay

## 2019-08-19 DIAGNOSIS — R011 Cardiac murmur, unspecified: Secondary | ICD-10-CM | POA: Insufficient documentation
